# Patient Record
Sex: Female | Born: 1970
Health system: Southern US, Community
[De-identification: ages and names within clinical notes are randomized; demographics above are authoritative.]

## PROBLEM LIST (undated history)

## (undated) DIAGNOSIS — J189 Pneumonia, unspecified organism: Secondary | ICD-10-CM

## (undated) DIAGNOSIS — R7303 Prediabetes: Secondary | ICD-10-CM

## (undated) DIAGNOSIS — F419 Anxiety disorder, unspecified: Secondary | ICD-10-CM

## (undated) DIAGNOSIS — E7800 Pure hypercholesterolemia, unspecified: Secondary | ICD-10-CM

## (undated) DIAGNOSIS — R011 Cardiac murmur, unspecified: Secondary | ICD-10-CM

## (undated) DIAGNOSIS — M1990 Unspecified osteoarthritis, unspecified site: Secondary | ICD-10-CM

## (undated) DIAGNOSIS — R51 Headache: Secondary | ICD-10-CM

## (undated) DIAGNOSIS — T7840XA Allergy, unspecified, initial encounter: Secondary | ICD-10-CM

## (undated) DIAGNOSIS — K219 Gastro-esophageal reflux disease without esophagitis: Secondary | ICD-10-CM

## (undated) DIAGNOSIS — R0981 Nasal congestion: Secondary | ICD-10-CM

## (undated) DIAGNOSIS — J40 Bronchitis, not specified as acute or chronic: Secondary | ICD-10-CM

## (undated) DIAGNOSIS — IMO0001 Reserved for inherently not codable concepts without codable children: Secondary | ICD-10-CM

## (undated) DIAGNOSIS — R519 Headache, unspecified: Secondary | ICD-10-CM

## (undated) DIAGNOSIS — R0989 Other specified symptoms and signs involving the circulatory and respiratory systems: Secondary | ICD-10-CM

## (undated) HISTORY — DX: Unspecified osteoarthritis, unspecified site: M19.90

## (undated) HISTORY — DX: Pure hypercholesterolemia, unspecified: E78.00

## (undated) HISTORY — DX: Anxiety disorder, unspecified: F41.9

## (undated) HISTORY — DX: Cardiac murmur, unspecified: R01.1

## (undated) HISTORY — DX: Prediabetes: R73.03

## (undated) HISTORY — PX: WISDOM TOOTH EXTRACTION: SHX21

## (undated) HISTORY — DX: Allergy, unspecified, initial encounter: T78.40XA

## (undated) HISTORY — PX: BARTHOLIN CYST MARSUPIALIZATION: SHX5383

---

## 1998-07-05 ENCOUNTER — Ambulatory Visit (HOSPITAL_COMMUNITY): Admission: RE | Admit: 1998-07-05 | Discharge: 1998-07-05 | Payer: Self-pay | Admitting: Obstetrics and Gynecology

## 2000-05-28 ENCOUNTER — Encounter: Payer: Self-pay | Admitting: Emergency Medicine

## 2000-05-28 ENCOUNTER — Emergency Department (HOSPITAL_COMMUNITY): Admission: EM | Admit: 2000-05-28 | Discharge: 2000-05-28 | Payer: Self-pay | Admitting: Emergency Medicine

## 2003-12-26 ENCOUNTER — Encounter: Admission: RE | Admit: 2003-12-26 | Discharge: 2004-01-31 | Payer: Self-pay | Admitting: Internal Medicine

## 2004-11-07 ENCOUNTER — Encounter: Admission: RE | Admit: 2004-11-07 | Discharge: 2004-11-07 | Payer: Self-pay | Admitting: Internal Medicine

## 2005-07-24 ENCOUNTER — Other Ambulatory Visit: Admission: RE | Admit: 2005-07-24 | Discharge: 2005-07-24 | Payer: Self-pay | Admitting: Obstetrics and Gynecology

## 2006-07-09 ENCOUNTER — Encounter: Admission: RE | Admit: 2006-07-09 | Discharge: 2006-07-09 | Payer: Self-pay | Admitting: Internal Medicine

## 2006-08-06 ENCOUNTER — Other Ambulatory Visit: Admission: RE | Admit: 2006-08-06 | Discharge: 2006-08-06 | Payer: Self-pay | Admitting: Obstetrics and Gynecology

## 2009-06-03 ENCOUNTER — Encounter: Admission: RE | Admit: 2009-06-03 | Discharge: 2009-06-03 | Payer: Self-pay | Admitting: Internal Medicine

## 2010-11-10 ENCOUNTER — Encounter: Payer: Self-pay | Admitting: Internal Medicine

## 2012-03-28 ENCOUNTER — Ambulatory Visit
Admission: RE | Admit: 2012-03-28 | Discharge: 2012-03-28 | Disposition: A | Discharge status: Home / Self Care | Payer: No Typology Code available for payment source | Source: Ambulatory Visit | Attending: Internal Medicine | Admitting: Internal Medicine

## 2012-03-28 ENCOUNTER — Other Ambulatory Visit: Payer: Self-pay | Admitting: Internal Medicine

## 2012-03-28 DIAGNOSIS — R079 Chest pain, unspecified: Secondary | ICD-10-CM

## 2014-02-20 ENCOUNTER — Other Ambulatory Visit: Payer: Self-pay | Admitting: Internal Medicine

## 2014-02-20 DIAGNOSIS — R42 Dizziness and giddiness: Secondary | ICD-10-CM

## 2014-02-28 ENCOUNTER — Ambulatory Visit
Admission: RE | Admit: 2014-02-28 | Discharge: 2014-02-28 | Disposition: A | Discharge status: Home / Self Care | Payer: PRIVATE HEALTH INSURANCE | Source: Ambulatory Visit | Attending: Internal Medicine | Admitting: Internal Medicine

## 2014-02-28 DIAGNOSIS — R42 Dizziness and giddiness: Secondary | ICD-10-CM

## 2014-02-28 MED ORDER — GADOBENATE DIMEGLUMINE 529 MG/ML IV SOLN
17.0000 mL | Freq: Once | INTRAVENOUS | Status: AC | PRN
  Administered 2014-02-28: 17 mL via INTRAVENOUS

## 2016-02-06 ENCOUNTER — Ambulatory Visit (INDEPENDENT_AMBULATORY_CARE_PROVIDER_SITE_OTHER): Payer: Commercial Managed Care - PPO | Admitting: Urgent Care

## 2016-02-06 VITALS — BP 124/72 | HR 86 | Temp 99.5°F | Resp 18 | Ht 67.0 in | Wt 209.0 lb

## 2016-02-06 DIAGNOSIS — R0789 Other chest pain: Secondary | ICD-10-CM

## 2016-02-06 DIAGNOSIS — J309 Allergic rhinitis, unspecified: Secondary | ICD-10-CM

## 2016-02-06 DIAGNOSIS — R0981 Nasal congestion: Secondary | ICD-10-CM

## 2016-02-06 DIAGNOSIS — R059 Cough, unspecified: Secondary | ICD-10-CM

## 2016-02-06 DIAGNOSIS — Z9109 Other allergy status, other than to drugs and biological substances: Secondary | ICD-10-CM

## 2016-02-06 DIAGNOSIS — J029 Acute pharyngitis, unspecified: Secondary | ICD-10-CM

## 2016-02-06 DIAGNOSIS — R05 Cough: Secondary | ICD-10-CM | POA: Diagnosis not present

## 2016-02-06 DIAGNOSIS — Z889 Allergy status to unspecified drugs, medicaments and biological substances status: Secondary | ICD-10-CM

## 2016-02-06 LAB — POCT CBC
Granulocyte percent: 62.8 %G (ref 37–80)
HEMATOCRIT: 41.4 % (ref 37.7–47.9)
HEMOGLOBIN: 14.7 g/dL (ref 12.2–16.2)
LYMPH, POC: 3.1 (ref 0.6–3.4)
MCH, POC: 27.6 pg (ref 27–31.2)
MCHC: 35.6 g/dL — AB (ref 31.8–35.4)
MCV: 77.7 fL — AB (ref 80–97)
MID (cbc): 0.9 (ref 0–0.9)
MPV: 8.1 fL (ref 0–99.8)
POC GRANULOCYTE: 6.7 (ref 2–6.9)
POC LYMPH PERCENT: 28.8 %L (ref 10–50)
POC MID %: 8.4 %M (ref 0–12)
Platelet Count, POC: 341 10*3/uL (ref 142–424)
RBC: 5.33 M/uL (ref 4.04–5.48)
RDW, POC: 15.2 %
WBC: 10.6 10*3/uL — AB (ref 4.6–10.2)

## 2016-02-06 MED ORDER — HYDROCODONE-HOMATROPINE 5-1.5 MG/5ML PO SYRP: 5.0000 mL | ORAL_SOLUTION | Freq: Every evening | ORAL | Status: DC | PRN

## 2016-02-06 MED ORDER — CETIRIZINE HCL 10 MG PO TABS: 10.0000 mg | ORAL_TABLET | Freq: Every day | ORAL | Status: DC

## 2016-02-06 MED ORDER — BENZONATATE 100 MG PO CAPS: 100.0000 mg | ORAL_CAPSULE | Freq: Three times a day (TID) | ORAL | Status: DC | PRN

## 2016-02-06 MED ORDER — PSEUDOEPHEDRINE HCL ER 120 MG PO TB12: 120.0000 mg | ORAL_TABLET | Freq: Two times a day (BID) | ORAL | Status: DC

## 2016-02-06 MED ORDER — ALBUTEROL SULFATE (2.5 MG/3ML) 0.083% IN NEBU
2.5000 mg | INHALATION_SOLUTION | Freq: Once | RESPIRATORY_TRACT | Status: AC
  Administered 2016-02-06: 2.5 mg via RESPIRATORY_TRACT

## 2016-02-06 NOTE — Progress Notes (Signed)
    MRN: [id number] DOB: 01-Jun-1971  Subjective:   Crystal Harris is a 45 y.o. female presenting for chief complaint of Cough; Sinusitis; and Bronchitis  Reports 5 day history of scratchy throat, sinus congestion, sinus drainage, itchy ears, mildly productive cough, cough elicited chest pain, burning sensation in her chest. She has been seen for this already, started her on amoxicillin and robitussin, muscle relaxant for her back. A chest x-ray was done, was normal. Patient did start taking generic Claritin twice. She has seen an allergist several years ago and had multiple environmental allergies. Used to get series of steroid shots and eventually stopped going because they got better. Denies smoking cigarettes.  Crystal Harris has a current medication list which includes the following prescription(s): amitriptyline and amoxicillin. Also has no allergies on file.  Crystal Harris  has a past medical history of Heart murmur; Allergy; Anemia; Anxiety; and Arthritis. Also  has past surgical history that includes Cesarean section.  Her family history includes Cancer in her father and sister; Diabetes in her mother; Hyperlipidemia in her brother and mother; Hypertension in her father and sister.   Objective:   Vitals: BP 124/72 mmHg  Pulse 86  Temp(Src) 99.5 F (37.5 C) (Oral)  Resp 18  Ht 5\' 7"  (1.702 m)  Wt 209 lb (94.802 kg)  BMI 32.73 kg/m2  SpO2 98%  LMP 01/16/2016  Physical Exam  Constitutional: She is oriented to person, place, and time. She appears well-developed and well-nourished.  HENT:  TM's flat bilaterally, no effusions or erythema. Nasal turbinates boggy with clear mucus. No sinus tenderness. Significant postnasal drip present, without oropharyngeal exudates, erythema or abscesses.  Eyes: Right eye exhibits no discharge. Left eye exhibits no discharge. No scleral icterus.  Neck: Normal range of motion. Neck supple.  Cardiovascular: Normal rate, regular rhythm and intact distal pulses.   Exam reveals no gallop and no friction rub.   No murmur heard. Pulmonary/Chest: No respiratory distress. She has no wheezes. She has no rales.  Abdominal: Soft. Bowel sounds are normal. She exhibits no distension and no mass. There is no tenderness.  Lymphadenopathy:    She has no cervical adenopathy.  Neurological: She is alert and oriented to person, place, and time.  Skin: Skin is warm and dry.   No results found for this or any previous visit (from the past 24 hour(s)).  Assessment and Plan :   1. Allergic rhinitis, unspecified allergic rhinitis type 2. Multiple allergies 3. Cough 4. Atypical chest pain 5. Nasal congestion 6. Sore throat - Likely allergy related. Recommended patient start Zyrtec, Sudafed. Use Hycodan and Tessalon as needed for cough and sore throat. RTC in 1 week if no improvement.  Jaynee Eagles, PA-C [facility] [phone] 02/06/2016 5:10 PM

## 2016-02-06 NOTE — Patient Instructions (Addendum)
Allergies An allergy is an abnormal reaction to a substance by the body's defense system (immune system). Allergies can develop at any age. WHAT CAUSES ALLERGIES? An allergic reaction happens when the immune system mistakenly reacts to a normally harmless substance, called an allergen, as if it were harmful. The immune system releases antibodies to fight the substance. Antibodies eventually release a chemical called histamine into the bloodstream. The release of histamine is meant to protect the body from infection, but it also causes discomfort. An allergic reaction can be triggered by:  Eating an allergen.  Inhaling an allergen.  Touching an allergen. WHAT TYPES OF ALLERGIES ARE THERE? There are many types of allergies. Common types include:  Seasonal allergies. People with this type of allergy are usually allergic to substances that are only present during certain seasons, such as molds and pollens.  Food allergies.  Drug allergies.  Insect allergies.  Animal dander allergies. WHAT ARE SYMPTOMS OF ALLERGIES? Possible allergy symptoms include:  Swelling of the lips, face, tongue, mouth, or throat.  Sneezing, coughing, or wheezing.  Nasal congestion.  Tingling in the mouth.  Rash.  Itching.  Itchy, red, swollen areas of skin (hives).  Watery eyes.  Vomiting.  Diarrhea.  Dizziness.  Lightheadedness.  Fainting.  Trouble breathing or swallowing.  Chest tightness.  Rapid heartbeat. HOW ARE ALLERGIES DIAGNOSED? Allergies are diagnosed with a medical and family history and one or more of the following:  Skin tests.  Blood tests.  A food diary. A food diary is a record of all the foods and drinks you have in a day and of all the symptoms you experience.  The results of an elimination diet. An elimination diet involves eliminating foods from your diet and then adding them back in one by one to find out if a certain food causes an allergic reaction. HOW ARE  ALLERGIES TREATED? There is no cure for allergies, but allergic reactions can be treated with medicine. Severe reactions usually need to be treated at a hospital. HOW CAN REACTIONS BE PREVENTED? The best way to prevent an allergic reaction is by avoiding the substance you are allergic to. Allergy shots and medicines can also help prevent reactions in some cases. People with severe allergic reactions may be able to prevent a life-threatening reaction called anaphylaxis with a medicine given right after exposure to the allergen.   This information is not intended to replace advice given to you by your health care provider. Make sure you discuss any questions you have with your health care provider.   Document Released: 12/29/2002 Document Revised: 10/26/2014 Document Reviewed: 07/17/2014 Elsevier Interactive Patient Education 2016 [employer] Inc[employer]    Cough, Adult Coughing is a reflex that clears your throat and your airways. Coughing helps to heal and protect your lungs. It is normal to cough occasionally, but a cough that happens with other symptoms or lasts a long time may be a sign of a condition that needs treatment. A cough may last only 2-3 weeks (acute), or it may last longer than 8 weeks (chronic). CAUSES Coughing is commonly caused by:  Breathing in substances that irritate your lungs.  A viral or bacterial respiratory infection.  Allergies.  Asthma.  Postnasal drip.  Smoking.  Acid backing up from the stomach into the esophagus (gastroesophageal reflux).  Certain medicines.  Chronic lung problems, including COPD (or rarely, lung cancer).  Other medical conditions such as heart failure. HOME CARE INSTRUCTIONS  Pay attention to any changes in your symptoms. Take these  actions to help with your discomfort:  Take medicines only as told by your health care provider.  If you were prescribed an antibiotic medicine, take it as told by your health care provider. Do not stop  taking the antibiotic even if you start to feel better.  Talk with your health care provider before you take a cough suppressant medicine.  Drink enough fluid to keep your urine clear or pale yellow.  If the air is dry, use a cold steam vaporizer or humidifier in your bedroom or your home to help loosen secretions.  Avoid anything that causes you to cough at work or at home.  If your cough is worse at night, try sleeping in a semi-upright position.  Avoid cigarette smoke. If you smoke, quit smoking. If you need help quitting, ask your health care provider.  Avoid caffeine.  Avoid alcohol.  Rest as needed. SEEK MEDICAL CARE IF:   You have new symptoms.  You cough up pus.  Your cough does not get better after 2-3 weeks, or your cough gets worse.  You cannot control your cough with suppressant medicines and you are losing sleep.  You develop pain that is getting worse or pain that is not controlled with pain medicines.  You have a fever.  You have unexplained weight loss.  You have night sweats. SEEK IMMEDIATE MEDICAL CARE IF:  You cough up blood.  You have difficulty breathing.  Your heartbeat is very fast.   This information is not intended to replace advice given to you by your health care provider. Make sure you discuss any questions you have with your health care provider.   Document Released: 04/03/2011 Document Revised: 06/26/2015 Document Reviewed: 12/12/2014 Elsevier Interactive Patient Education 2016 [employer].     IF you received an x-ray today, you will receive an invoice from [facility]. Please contact [facility] at [phone] with questions or concerns regarding your invoice.   IF you received labwork today, you will receive an invoice from [employer]. Please contact [facility] at [phone] with questions or concerns regarding your invoice.   Our billing staff will not be able to assist you with  questions regarding bills from these companies.  You will be contacted with the lab results as soon as they are available. The fastest way to get your results is to activate your My Chart account. Instructions are located on the last page of this paperwork. If you have not heard from [place] regarding the results in 2 weeks, please contact this office.

## 2016-02-08 ENCOUNTER — Encounter: Payer: Self-pay | Admitting: Podiatry

## 2016-02-08 ENCOUNTER — Ambulatory Visit (INDEPENDENT_AMBULATORY_CARE_PROVIDER_SITE_OTHER): Payer: Commercial Managed Care - PPO | Admitting: Podiatry

## 2016-02-08 ENCOUNTER — Ambulatory Visit (INDEPENDENT_AMBULATORY_CARE_PROVIDER_SITE_OTHER): Payer: Commercial Managed Care - PPO

## 2016-02-08 DIAGNOSIS — R52 Pain, unspecified: Secondary | ICD-10-CM | POA: Diagnosis not present

## 2016-02-08 DIAGNOSIS — M779 Enthesopathy, unspecified: Secondary | ICD-10-CM

## 2016-02-08 DIAGNOSIS — M2580 Other specified joint disorders, unspecified joint: Secondary | ICD-10-CM

## 2016-02-08 NOTE — Patient Instructions (Signed)

## 2016-02-08 NOTE — Progress Notes (Signed)
   Subjective:    Patient ID: Crystal Harris, female    DOB: 1971-07-19, 45 y.o.   MRN: [id number]  HPI  45 year old female presents the office today for concerns of pain to the ball of her right foot for which she points submetatarsal one. This been ongoing for last several weeks and she has pain when she goes barefoot or she wears flat shoes. She denies any injury or trauma. She has noticed some swelling to the area without any redness. No tingling or numbness. She's had no previous treatment. No other complaints at this time.   Review of Systems  All other systems reviewed and are negative.      Objective:   Physical Exam General: AAO x3, NAD  Dermatological: Small hyperkeratotic lesions are present medial hallux IPJ as well as submetatarsal 2. No underlying ulceration, drainage or other signs of infection. No other open lesions or pre-ulcer lesions identified at this time.  Vascular: Dorsalis Pedis artery and Posterior Tibial artery pedal pulses are 2/4 bilateral with immedate capillary fill time. Pedal hair growth present. No varicosities and no lower extremity edema present bilateral. There is no pain with calf compression, swelling, warmth, erythema.   Neruologic: Grossly intact via light touch bilateral. Vibratory intact via tuning fork bilateral. Protective threshold with Semmes Wienstein monofilament intact to all pedal sites bilateral. Patellar and Achilles deep tendon reflexes 2+ bilateral. No Babinski or clonus noted bilateral.   Musculoskeletal: There is tenderness underlying sesamoids and the right foot. There is localized edema to this area without any erythema or increase in warmth. There is no pain with MPJ range of motion. There is no pain metatarsal of the digit. There is no pain to the other areas of the foot bilaterally. There is equinus present. MMT 5/5. Mild HAV on right.   Gait: Unassisted, Nonantalgic.       Assessment & Plan:  45 year old female right  sesamoiditis -Treatment options discussed including all alternatives, risks, and complications -Etiology of symptoms were discussed -X-rays were obtained and reviewed with the patient. No evidence of acute fracture or stress fracture. -Given other medical conditions she cannot take anti-inflammatory she states. We'll hold off. -Discussed steroid injection for which she wishes to proceed with. Under sterile conditions a mixture of dexamethasone phosphate and local anesthetic was infiltrated around the sesamoid complex and the right foot. Postinjection care with discussed. -Offloading has were dispensed. -Discussed shoe gear changes. -Equinus exercises. -Hyperkeratotic lesions lightly debrided. -Follow up in 4 weeks if symptoms continue or sooner if any issues are to arise. Encouraged to call the questions, concerns or any change in symptoms.  Celesta Gentile, DPM

## 2016-03-06 ENCOUNTER — Ambulatory Visit (INDEPENDENT_AMBULATORY_CARE_PROVIDER_SITE_OTHER): Payer: Commercial Managed Care - PPO | Admitting: Podiatry

## 2016-03-06 ENCOUNTER — Encounter: Payer: Self-pay | Admitting: Podiatry

## 2016-03-06 VITALS — BP 118/75 | HR 90 | Resp 18

## 2016-03-06 DIAGNOSIS — M2580 Other specified joint disorders, unspecified joint: Secondary | ICD-10-CM

## 2016-03-06 MED ORDER — MELOXICAM 15 MG PO TABS: 15.0000 mg | ORAL_TABLET | Freq: Every day | ORAL | Status: DC

## 2016-03-06 MED ORDER — METHYLPREDNISOLONE 4 MG PO TBPK: ORAL_TABLET | ORAL | Status: DC

## 2016-03-06 NOTE — Patient Instructions (Signed)
Start taking the medrol dose pack once this is compete you can start mobic (meloxicam) but don't take together.

## 2016-03-11 NOTE — Progress Notes (Signed)
Patient ID: Crystal Harris, female   DOB: June 26, 1971, 45 y.o.   MRN: [id number]  Subjective: 45 year old female presents the office they for follow-up evaluation of sesamoiditis on the right foot. She states that she's had some improvement in symptoms she does continue to have pain. The swelling has improved. No redness or warmth. She has not been wearing the pad every day but when she does it does help. Denies any systemic complaints such as fevers, chills, nausea, vomiting. No acute changes since last appointment, and no other complaints at this time.   Objective: AAO x3, NAD DP/PT pulses palpable bilaterally, CRT less than 3 seconds There is continuation tenderness as well as has wide-complex of the right foot. There is localized edema to this area for any erythema or increase in warmth. There is no pain vibratory sensation.  MPJ range of motion. No other areas of tenderness. No areas of pinpoint bony tenderness or pain with vibratory sensation. MMT 5/5, ROM WNL. No edema, erythema, increase in warmth to bilateral lower extremities.  No open lesions or pre-ulcerative lesions.  No pain with calf compression, swelling, warmth, erythema  Assessment: Right foot likely sesamoiditis  Plan: -All treatment options discussed with the patient including all alternatives, risks, complications.  -At this time I discussed further treatment options. Prescribed Medrol Dosepak. Once this is complete she can start meloxicam. Do not together and she verbally understood. Discussed side effects lesion to medicines. Also continue offloading pads for now. Discussed custom orthotics. She can modifications. Ice to the area. Follow as scheduled. -Patient encouraged to call the office with any questions, concerns, change in symptoms.   Celesta Gentile, DPM

## 2016-04-03 ENCOUNTER — Encounter: Payer: Self-pay | Admitting: Podiatry

## 2016-04-03 ENCOUNTER — Ambulatory Visit (INDEPENDENT_AMBULATORY_CARE_PROVIDER_SITE_OTHER): Payer: Commercial Managed Care - PPO | Admitting: Podiatry

## 2016-04-03 DIAGNOSIS — M779 Enthesopathy, unspecified: Secondary | ICD-10-CM | POA: Diagnosis not present

## 2016-04-03 DIAGNOSIS — M2580 Other specified joint disorders, unspecified joint: Secondary | ICD-10-CM

## 2016-04-09 NOTE — Progress Notes (Signed)
Patient ID: MAYTE CERISE, female   DOB: April 30, 1971, 45 y.o.   MRN: [id number]  Subjective: 45 year old female presents the office they for follow-up evaluation of sesamoiditis on the right foot. She states that she is doing somewhat better, the last appointment information as gone down but she still has soreness when she is on her feet all day. She does still have some swelling to the area and denies any redness or warmth. No recent injury. No other complains.  Objective: AAO x3, NAD DP/PT pulses palpable bilaterally, CRT less than 3 seconds There is continuation tenderness on the plantar aspect the right foot along the sesamoids. There is decreased edema however does persist somewhat. There is no redness or warmth to the area. No pain with MPJ range of motion. No other areas of tenderness bilateral lower extremities.  No edema, erythema, increase in warmth to bilateral lower extremities.  No open lesions or pre-ulcerative lesions.  No pain with calf compression, swelling, warmth, erythema  Assessment: Right foot likely sesamoiditis  Plan: -All treatment options discussed with the patient including all alternatives, risks, complications.  -At this time a discussed steroid injection she wishes to proceed with this. Under sterile conditions a mixture Dexamethasone phosphate and local wound site was infiltrated without complications or bleeding. Post injection care was discussed with the patient. -Offloading pads were dispensed. -Follow-up in 3 weeks or sooner if any problems arise. In the meantime, encouraged to call the office with any questions, concerns, change in symptoms.   Celesta Gentile, DPM

## 2016-04-18 ENCOUNTER — Telehealth: Payer: Self-pay

## 2016-04-18 ENCOUNTER — Other Ambulatory Visit: Payer: Self-pay | Admitting: Emergency Medicine

## 2016-04-18 ENCOUNTER — Ambulatory Visit (INDEPENDENT_AMBULATORY_CARE_PROVIDER_SITE_OTHER): Payer: Commercial Managed Care - PPO | Admitting: Family Medicine

## 2016-04-18 VITALS — BP 100/72 | HR 98 | Temp 98.0°F | Resp 16 | Ht 66.25 in | Wt 206.4 lb

## 2016-04-18 DIAGNOSIS — R5383 Other fatigue: Secondary | ICD-10-CM | POA: Diagnosis not present

## 2016-04-18 DIAGNOSIS — Z889 Allergy status to unspecified drugs, medicaments and biological substances status: Secondary | ICD-10-CM

## 2016-04-18 DIAGNOSIS — Z9109 Other allergy status, other than to drugs and biological substances: Secondary | ICD-10-CM | POA: Diagnosis not present

## 2016-04-18 DIAGNOSIS — R0981 Nasal congestion: Secondary | ICD-10-CM | POA: Diagnosis not present

## 2016-04-18 DIAGNOSIS — N921 Excessive and frequent menstruation with irregular cycle: Secondary | ICD-10-CM | POA: Diagnosis not present

## 2016-04-18 LAB — POCT CBC
Granulocyte percent: 60.6 %G (ref 37–80)
HCT, POC: 38.2 % (ref 37.7–47.9)
HEMOGLOBIN: 13.1 g/dL (ref 12.2–16.2)
Lymph, poc: 2.9 (ref 0.6–3.4)
MCH: 28.2 pg (ref 27–31.2)
MCHC: 34.5 g/dL (ref 31.8–35.4)
MCV: 81.7 fL (ref 80–97)
MID (cbc): 0.7 (ref 0–0.9)
MPV: 7.9 fL (ref 0–99.8)
PLATELET COUNT, POC: 303 10*3/uL (ref 142–424)
POC Granulocyte: 5.6 (ref 2–6.9)
POC LYMPH PERCENT: 31.5 %L (ref 10–50)
POC MID %: 7.9 %M (ref 0–12)
RBC: 4.67 M/uL (ref 4.04–5.48)
RDW, POC: 15.7 %
WBC: 9.3 10*3/uL (ref 4.6–10.2)

## 2016-04-18 MED ORDER — ESTRADIOL 1 MG PO TABS: 4.0000 mg | ORAL_TABLET | Freq: Four times a day (QID) | ORAL | Status: DC

## 2016-04-18 MED ORDER — AMITRIPTYLINE HCL 10 MG PO TABS: 10.0000 mg | ORAL_TABLET | Freq: Every day | ORAL | Status: DC

## 2016-04-18 MED ORDER — ONDANSETRON 4 MG PO TBDP: 4.0000 mg | ORAL_TABLET | Freq: Three times a day (TID) | ORAL | Status: DC | PRN

## 2016-04-18 MED ORDER — CETIRIZINE HCL 10 MG PO TABS: 10.0000 mg | ORAL_TABLET | Freq: Every day | ORAL | Status: DC

## 2016-04-18 MED ORDER — MEDROXYPROGESTERONE ACETATE 10 MG PO TABS: 10.0000 mg | ORAL_TABLET | Freq: Every day | ORAL | Status: DC

## 2016-04-18 MED ORDER — ESTRADIOL 2 MG PO TABS: 8.0000 mg | ORAL_TABLET | Freq: Four times a day (QID) | ORAL | Status: DC

## 2016-04-18 NOTE — Progress Notes (Addendum)
Subjective:  By signing my name below, I, Moises Blood, attest that this documentation has been prepared under the direction and in the presence of Delman Cheadle, MD. Electronically Signed: Moises Blood, [place]. 04/18/2016 , 2:17 PM .  Patient was seen in Room 8 .   Patient ID: Crystal Harris, female    DOB: Jan 21, 1971, 45 y.o.   MRN: [id number] Chief Complaint  Patient presents with  . MENTRAL CYCLE    heavy started 2 days ago  . Fatigue  . Medication Refill    amitriptyline and zyrtec   HPI Crystal Harris is a 45 y.o. female who presents to [facility] complaining of heavy menstrual cycle that was noticed yesterday. Patient states her cycle started 2 days ago. She mentions her period has been heavier and needing to change pads about every hour. She also notes menses becoming irregular, LMP was 2.5 weeks ago and they were much lighter than usual. She's feeling more fatigue but denies history of anemia. She reports having lightheadedness and dizziness but has had this ongoing for a while with association to vertigo. She denies vaginal discharge or change in frequency. She did notice some discomfort when urinating, similar to when she had UTI's in the past. Her last pelvic exam was done about over 5 years ago.   She also requested medication refill. Her last thyroid was checked a few years ago. She denies PCP at the moment.   Past Medical History  Diagnosis Date  . Heart murmur   . Allergy   . Anemia   . Anxiety   . Arthritis    Prior to Admission medications   Medication Sig Start Date End Date Taking? Authorizing Provider  amitriptyline (ELAVIL) 10 MG tablet Take 10 mg by mouth at bedtime.   Yes Historical Provider, MD  cetirizine (ZYRTEC) 10 MG tablet Take 1 tablet (10 mg total) by mouth daily. 02/06/16  Yes Jaynee Eagles, PA-C  meloxicam (MOBIC) 15 MG tablet Take 1 tablet (15 mg total) by mouth daily. Patient not taking: Reported on 04/18/2016 03/06/16   Trula Slade, DPM   No Known  Allergies  Review of Systems  Constitutional: Positive for fatigue. Negative for fever and chills.  Gastrointestinal: Negative for nausea, vomiting and diarrhea.  Genitourinary: Positive for dysuria and menstrual problem. Negative for urgency, frequency, hematuria and vaginal discharge.  Neurological: Positive for dizziness and light-headedness.       Objective:   Physical Exam  Constitutional: She is oriented to person, place, and time. She appears well-developed and well-nourished. No distress.  HENT:  Head: Normocephalic and atraumatic.  Eyes: EOM are normal. Pupils are equal, round, and reactive to light.  Neck: Neck supple.  Cardiovascular: Normal rate.   Pulmonary/Chest: Effort normal and breath sounds normal. No respiratory distress.  Genitourinary:  Normal external labia, normal vagina, large amount of dark red maroon cervical blood from os; cervix normal, some clotting in vault, no cervical motion tenderness; uterus felt firm and large, no adnexal fullness or tenderness  Musculoskeletal: Normal range of motion.  Neurological: She is alert and oriented to person, place, and time.  Skin: Skin is warm and dry.  Psychiatric: She has a normal mood and affect. Her behavior is normal.  Nursing note and vitals reviewed.   BP 100/72 mmHg  Pulse 98  Temp(Src) 98 F (36.7 C) (Oral)  Resp 16  Ht 5' 6.25" (1.683 m)  Wt 206 lb 6.4 oz (93.622 kg)  BMI 33.05 kg/m2  SpO2 98%  LMP 04/16/2016   Results for orders placed or performed in visit on 04/18/16  POCT CBC  Result Value Ref Range   WBC 9.3 4.6 - 10.2 K/uL   Lymph, poc 2.9 0.6 - 3.4   POC LYMPH PERCENT 31.5 10 - 50 %L   MID (cbc) 0.7 0 - 0.9   POC MID % 7.9 0 - 12 %M   POC Granulocyte 5.6 2 - 6.9   Granulocyte percent 60.6 37 - 80 %G   RBC 4.67 4.04 - 5.48 M/uL   Hemoglobin 13.1 12.2 - 16.2 g/dL   HCT, POC 38.2 37.7 - 47.9 %   MCV 81.7 80 - 97 fL   MCH, POC 28.2 27 - 31.2 pg   MCHC 34.5 31.8 - 35.4 g/dL   RDW, POC  15.7 %   Platelet Count, POC 303 142 - 424 K/uL   MPV 7.9 0 - 99.8 fL       Assessment & Plan:   1. Menorrhagia with irregular cycle  - pt with impressively heavy menstrual bleeding confirmed on exam.  Will refer to gyn for more complete lab work up and pelvic/transvag US.  Will use high dose estrogen to stop menses with estradiol 1mg  qid followed by provera 10mg  x 10d.  zofran in case high dose estrogen -> nausea  2. Other fatigue - try to increase elavil from 10 to 25mg  qhs  3. Nasal congestion   4. Multiple allergies - refill zyrtec.     Orders Placed This Encounter  Procedures  . TSH  . Ambulatory referral to Gynecology    Referral Priority:  Routine    Referral Type:  Consultation    Referral Reason:  Specialty Services Required    Requested Specialty:  Gynecology    Number of Visits Requested:  1  . POCT CBC    Meds ordered this encounter  Medications  . cetirizine (ZYRTEC) 10 MG tablet    Sig: Take 1 tablet (10 mg total) by mouth daily.    Dispense:  30 tablet    Refill:  11  . amitriptyline (ELAVIL) 10 MG tablet    Sig: Take 1 tablet (10 mg total) by mouth at bedtime.    Dispense:  90 tablet    Refill:  3  . DISCONTD: estradiol (ESTRACE) 2 MG tablet    Sig: Take 4 tablets (8 mg total) by mouth 4 (four) times daily. Until bleeding stops    Dispense:  80 tablet    Refill:  0  . medroxyPROGESTERone (PROVERA) 10 MG tablet    Sig: Take 1 tablet (10 mg total) by mouth daily.    Dispense:  10 tablet    Refill:  0  . ondansetron (ZOFRAN ODT) 4 MG disintegrating tablet    Sig: Take 1 tablet (4 mg total) by mouth every 8 (eight) hours as needed for nausea or vomiting.    Dispense:  20 tablet    Refill:  0  . DISCONTD: estradiol (ESTRACE) 1 MG tablet    Sig: Take 4 tablets (4 mg total) by mouth 4 (four) times daily. Until bleeding stops    Dispense:  80 tablet    Refill:  0    Please disregard prior rx for the 2mg  pills - pt is to take 4mg  qid (so will do 4 tabs of 1  mg qid or ok to do 2 tabs of the 2mg  pills qid if cheaper and make sure you let pt know about change in tab #).  Thanks!  Marland Kitchen estradiol (ESTRACE) 1 MG tablet    Sig: Take 4 tablets (4 mg total) by mouth 4 (four) times daily. Until bleeding stops    Dispense:  80 tablet    Refill:  0    Please disregard prior rx for the 2mg  pills    I personally performed the services described in this documentation, which was scribed in my presence. The recorded information has been reviewed and considered, and addended by me as needed.   Delman Cheadle, M.D.  [facility] [address] [place], [place] [address] [phone] phone [phone] fax  04/27/2016 1:49 PM

## 2016-04-18 NOTE — Patient Instructions (Addendum)
IF you received an x-ray today, you will receive an invoice from [facility]. Please contact [facility] at [phone] with questions or concerns regarding your invoice.   IF you received labwork today, you will receive an invoice from [employer]. Please contact [facility] at [phone] with questions or concerns regarding your invoice.   Our billing staff will not be able to assist you with questions regarding bills from these companies.  You will be contacted with the lab results as soon as they are available. The fastest way to get your results is to activate your My Chart account. Instructions are located on the last page of this paperwork. If you have not heard from [place] regarding the results in 2 weeks, please contact this office.    Estradiol 8 mg four times per dayestrogen  until the bleeding subsides or is minimal. Do not continue this regimen for more than 21 to 25 days. After the is discontinued, start oral medroxyprogesterone acetate (10 mg/day) for 10 days.  Menorrhagia Menorrhagia is the medical term for when your menstrual periods are heavy or last longer than usual. With menorrhagia, every period you have may cause enough blood loss and cramping that you are unable to maintain your usual activities. CAUSES  In some cases, the cause of heavy periods is unknown, but a number of conditions may cause menorrhagia. Common causes include:  A problem with the hormone-producing thyroid gland (hypothyroid).  Noncancerous growths in the uterus (polyps or fibroids).  An imbalance of the estrogen and progesterone hormones.  One of your ovaries not releasing an egg during one or more months.  Side effects of having an intrauterine device (IUD).  Side effects of some medicines, such as anti-inflammatory medicines or blood thinners.  A bleeding disorder that stops your blood from clotting normally. SIGNS AND SYMPTOMS  During a normal  period, bleeding lasts between 4 and 8 days. Signs that your periods are too heavy include:  You routinely have to change your pad or tampon every 1 or 2 hours because it is completely soaked.  You pass blood clots larger than 1 inch (2.5 cm) in size.  You have bleeding for more than 7 days.  You need to use pads and tampons at the same time because of heavy bleeding.  You need to wake up to change your pads or tampons during the night.  You have symptoms of anemia, such as tiredness, fatigue, or shortness of breath. DIAGNOSIS  Your health care provider will perform a physical exam and ask you questions about your symptoms and menstrual history. Other tests may be ordered based on what the health care provider finds during the exam. These tests can include:  Blood tests. Blood tests are used to check if you are pregnant or have hormonal changes, a bleeding or thyroid disorder, low iron levels (anemia), or other problems.  Endometrial biopsy. Your health care provider takes a sample of tissue from the inside of your uterus to be examined under a microscope.  Pelvic ultrasound. This test uses sound waves to make a picture of your uterus, ovaries, and vagina. The pictures can show if you have fibroids or other growths.  Hysteroscopy. For this test, your health care provider will use a small telescope to look inside your uterus. Based on the results of your initial tests, your health care provider may recommend further testing. TREATMENT  Treatment may not be needed. If it is needed, your health care provider may  recommend treatment with one or more medicines first. If these do not reduce bleeding enough, a surgical treatment might be an option. The best treatment for you will depend on:   Whether you need to prevent pregnancy.  Your desire to have children in the future.  The cause and severity of your bleeding.  Your opinion and personal preference.  Medicines for menorrhagia may  include:  Birth control methods that use hormones. These include the pill, skin patch, vaginal ring, shots that you get every 3 months, hormonal IUD, and implant. These treatments reduce bleeding during your menstrual period.  Medicines that thicken blood and slow bleeding.  Medicines that reduce swelling, such as ibuprofen.  Medicines that contain a synthetic hormone called progestin.   Medicines that make the ovaries stop working for a short time.  You may need surgical treatment for menorrhagia if the medicines are unsuccessful. Treatment options include:  Dilation and curettage (D&C). In this procedure, your health care provider opens (dilates) your cervix and then scrapes or suctions tissue from the lining of your uterus to reduce menstrual bleeding.  Operative hysteroscopy. This procedure uses a tiny tube with a light (hysteroscope) to view your uterine cavity and can help in the surgical removal of a polyp that may be causing heavy periods.  Endometrial ablation. Through various techniques, your health care provider permanently destroys the entire lining of your uterus (endometrium). After endometrial ablation, most women have little or no menstrual flow. Endometrial ablation reduces your ability to become pregnant.  Endometrial resection. This surgical procedure uses an electrosurgical wire loop to remove the lining of the uterus. This procedure also reduces your ability to become pregnant.  Hysterectomy. Surgical removal of the uterus and cervix is a permanent procedure that stops menstrual periods. Pregnancy is not possible after a hysterectomy. This procedure requires anesthesia and hospitalization. HOME CARE INSTRUCTIONS   Only take over-the-counter or prescription medicines as directed by your health care provider. Take prescribed medicines exactly as directed. Do not change or switch medicines without consulting your health care provider.  Take any prescribed iron pills  exactly as directed by your health care provider. Long-term heavy bleeding may result in low iron levels. Iron pills help replace the iron your body lost from heavy bleeding. Iron may cause constipation. If this becomes a problem, increase the bran, fruits, and roughage in your diet.  Do not take aspirin or medicines that contain aspirin 1 week before or during your menstrual period. Aspirin may make the bleeding worse.  If you need to change your sanitary pad or tampon more than once every 2 hours, stay in bed and rest as much as possible until the bleeding stops.  Eat well-balanced meals. Eat foods high in iron. Examples are leafy green vegetables, meat, liver, eggs, and whole grain breads and cereals. Do not try to lose weight until the abnormal bleeding has stopped and your blood iron level is back to normal. SEEK MEDICAL CARE IF:   You soak through a pad or tampon every 1 or 2 hours, and this happens every time you have a period.  You need to use pads and tampons at the same time because you are bleeding so much.  You need to change your pad or tampon during the night.  You have a period that lasts for more than 8 days.  You pass clots bigger than 1 inch wide.  You have irregular periods that happen more or less often than once a month.  You  feel dizzy or faint.  You feel very weak or tired.  You feel short of breath or feel your heart is beating too fast when you exercise.  You have nausea and vomiting or diarrhea while you are taking your medicine.  You have any problems that may be related to the medicine you are taking. SEEK IMMEDIATE MEDICAL CARE IF:   You soak through 4 or more pads or tampons in 2 hours.  You have any bleeding while you are pregnant. MAKE SURE YOU:   Understand these instructions.  Will watch your condition.  Will get help right away if you are not doing well or get worse.   This information is not intended to replace advice given to you by your  health care provider. Make sure you discuss any questions you have with your health care provider.   Document Released: 10/05/2005 Document Revised: 10/10/2013 Document Reviewed: 03/26/2013 Elsevier Interactive Patient Education [employer].

## 2016-04-19 LAB — TSH: TSH: 2.32 m[IU]/L

## 2016-04-21 ENCOUNTER — Encounter: Payer: Self-pay | Admitting: Family Medicine

## 2016-05-04 ENCOUNTER — Ambulatory Visit: Payer: Commercial Managed Care - PPO | Admitting: Podiatry

## 2016-05-08 ENCOUNTER — Ambulatory Visit (INDEPENDENT_AMBULATORY_CARE_PROVIDER_SITE_OTHER): Payer: Commercial Managed Care - PPO | Admitting: Gynecology

## 2016-05-08 ENCOUNTER — Encounter: Payer: Self-pay | Admitting: Gynecology

## 2016-05-08 VITALS — BP 130/78 | Ht 66.5 in | Wt 208.0 lb

## 2016-05-08 DIAGNOSIS — N938 Other specified abnormal uterine and vaginal bleeding: Secondary | ICD-10-CM | POA: Diagnosis not present

## 2016-05-08 LAB — PREGNANCY, URINE: Preg Test, Ur: NEGATIVE

## 2016-05-08 MED ORDER — MEGESTROL ACETATE 40 MG PO TABS: 40.0000 mg | ORAL_TABLET | Freq: Two times a day (BID) | ORAL | Status: DC

## 2016-05-08 NOTE — Progress Notes (Signed)
   HPI: Patient is a 45 year old gravida 3 para 2 AB 1 (1 elective AB, 1 vaginal delivery, one cesarean section) who was referred to our practice as a courtesy of Dr. Rita Ohara as a result of patient's recent dysfunction uterine bleeding. She was seen at the urgent care on July 1 reporting heavy menstrual bleeding whereby tampons and pad were being change periodically. She stated for the past several months her cycles has started to become irregular and she has not been using any form of contraception she was having some lightheadedness and dizziness during that time she thought it was vertigo. She had a CBC done at that office visit which demonstrated her white blood count was 9.3 and her hemoglobin hematocrit 13.1 and 30.2 respectively with a platelet count 303,000. Patient stated that prior to that visit she had not seen a gynecologist in over 10 years and reports no prior history of any abnormal Pap smears. When patient was in the urgent care she had been prescribed Estrace 1 tablet 4 times a day to stop her bleeding and then to take Provera which she took for 10 days. She is continued to bleed afterwards.    ROS: A ROS was performed and pertinent positives and negatives are included in the history.  GENERAL: No fevers or chills. HEENT: No change in vision, no earache, sore throat or sinus congestion. NECK: No pain or stiffness. CARDIOVASCULAR: No chest pain or pressure. No palpitations. PULMONARY: No shortness of breath, cough or wheeze. GASTROINTESTINAL: No abdominal pain, nausea, vomiting or diarrhea, melena or bright red blood per rectum. GENITOURINARY: No urinary frequency, urgency, hesitancy or dysuria. MUSCULOSKELETAL: No joint or muscle pain, no back pain, no recent trauma. DERMATOLOGIC: No rash, no itching, no lesions. ENDOCRINE: No polyuria, polydipsia, no heat or cold intolerance. No recent change in weight. HEMATOLOGICAL: No anemia or easy bruising or bleeding. NEUROLOGIC: No headache,  seizures, numbness, tingling or weakness. PSYCHIATRIC: No depression, no loss of interest in normal activity or change in sleep pattern.   PE: Blood pressure 130/78   weight 208 pounds   BMI 33.07 Gen. appearance well-developed well-nourished seen with above-mentioned complaining Abdomen: Soft nontender no rebound or guarding Pelvic: Bartholin urethra Skene was within normal limits dried blood on patient's medial thighs were noted Vagina: Blood was present in the vaginal vault Cervix: Blood was noted at the cervical os but no active bleeding Uterus: Anteverted nontender no masses or tenderness Adnexa: No palpable masses or tenderness Rectal exam: Not done  Patient was counseled for an endometrial biopsy. A urine presented test was done in the office which was negative. The cervix was cleansed with Betadine solution. A sterile Pipelle was introduced into the uterine cavity and moderate amount of tissue was obtained submitted for histological evaluation.  Assessment/plan: 45 year old gravida 3 para 2 AB 1 with dysfunctional uterine bleeding will be prescribed Megace 40 mg twice a day for 10 days patient was then return to the office next week for sonohysterogram. Endometrial biopsy done today results pending at time of this dictation. A CBC will be checked today to compare with previous blood tests 3 weeks ago.   Assessment Plan:    Greater than 50% of time was spent in counseling and coordinating care of this patient.   Time of consultation: 30   Minutes.

## 2016-05-08 NOTE — Patient Instructions (Signed)

## 2016-05-08 NOTE — Addendum Note (Signed)
Addended by: Thurnell Garbe A on: 05/08/2016 03:11 PM   Modules accepted: Orders

## 2016-05-11 ENCOUNTER — Other Ambulatory Visit: Payer: Self-pay | Admitting: Gynecology

## 2016-05-11 DIAGNOSIS — N939 Abnormal uterine and vaginal bleeding, unspecified: Secondary | ICD-10-CM

## 2016-05-12 ENCOUNTER — Telehealth: Payer: Self-pay | Admitting: Gynecology

## 2016-05-12 NOTE — Telephone Encounter (Signed)
05/12/16-Pt was advised today that her [facility] insurance will cover the sonohysterogram and bx if needed under her $25 copay. Per [email or web address]

## 2016-05-19 ENCOUNTER — Encounter: Payer: Self-pay | Admitting: Gynecology

## 2016-05-19 ENCOUNTER — Ambulatory Visit (INDEPENDENT_AMBULATORY_CARE_PROVIDER_SITE_OTHER): Payer: Commercial Managed Care - PPO | Admitting: Gynecology

## 2016-05-19 ENCOUNTER — Ambulatory Visit (INDEPENDENT_AMBULATORY_CARE_PROVIDER_SITE_OTHER): Payer: Commercial Managed Care - PPO

## 2016-05-19 ENCOUNTER — Other Ambulatory Visit: Payer: Self-pay | Admitting: Gynecology

## 2016-05-19 DIAGNOSIS — N852 Hypertrophy of uterus: Secondary | ICD-10-CM

## 2016-05-19 DIAGNOSIS — R938 Abnormal findings on diagnostic imaging of other specified body structures: Secondary | ICD-10-CM | POA: Diagnosis not present

## 2016-05-19 DIAGNOSIS — N939 Abnormal uterine and vaginal bleeding, unspecified: Secondary | ICD-10-CM

## 2016-05-19 DIAGNOSIS — N8501 Benign endometrial hyperplasia: Secondary | ICD-10-CM

## 2016-05-19 DIAGNOSIS — D252 Subserosal leiomyoma of uterus: Secondary | ICD-10-CM

## 2016-05-19 DIAGNOSIS — N938 Other specified abnormal uterine and vaginal bleeding: Secondary | ICD-10-CM | POA: Diagnosis not present

## 2016-05-19 DIAGNOSIS — R9389 Abnormal findings on diagnostic imaging of other specified body structures: Secondary | ICD-10-CM

## 2016-05-19 DIAGNOSIS — D251 Intramural leiomyoma of uterus: Secondary | ICD-10-CM | POA: Insufficient documentation

## 2016-05-19 DIAGNOSIS — N9489 Other specified conditions associated with female genital organs and menstrual cycle: Secondary | ICD-10-CM

## 2016-05-19 DIAGNOSIS — N840 Polyp of corpus uteri: Secondary | ICD-10-CM

## 2016-05-19 NOTE — Progress Notes (Signed)
   Patient is a 45 year old gravida 3 para 2 AB 1 (1 elective AB, 1 vaginal delivery, one cesarean section) who was referred to our practice as a courtesy of Dr. Rita Ohara as a result of patient's recent dysfunction uterine bleeding on 05/08/2016. Patient on this last office visit had an endometrial biopsy which demonstrated the following:  Endometrium, biopsy, uterus - MIXED PHASE ENDOMETRIUM WITH PROMINENT BREAKDOWN ASSOCIATED WITH FRAGMENTS OF ENDOMETRIAL POLYP(S). - SEE COMMENT. Microscopic Comment Some of the polypoid fragments display areas of glandular crowding consistent with simple hyperplasia. There is no significant cytologic atypia or malignancy. Clinical correlation is recommended. (BNS:ds 05/11/16)  She was seen at the urgent care on July 1 reporting heavy menstrual bleeding whereby tampons and pad were being change periodically. She stated for the past several months her cycles has started to become irregular and she has not been using any form of contraception she was having some lightheadedness and dizziness during that time she thought it was vertigo. She had a CBC done at that office visit which demonstrated her white blood count was 9.3 and her hemoglobin hematocrit 13.1 and 30.2 respectively with a platelet count 303,000. Patient stated that prior to that visit she had not seen a gynecologist in over 10 years and reports no prior history of any abnormal Pap smears. When patient was in the urgent care she had been prescribed Estrace 1 tablet 4 times a day to stop her bleeding and then to take Provera which she took for 10 days. She was started on Megace 40 mg twice a day when I last saw her until a few days ago when she ran out.  Patient is using condoms for contraception.  Patient is here for sonohysterogram today to discuss the pathology report as described above.   Patient ultrasound and history the following: Uterus measured 10.6 x 7.4 x 6.3 cm with endometrial stripe of 17  mm prominent endometrium. Left mass appears to be extended from the wall of the uterus suggesting a subserous fibroid measuring 78 x 50 x 57 mm. Lower uterine feeder vessel was noted. Unable to identify the right or left ovary. The cervix was cleansed with Betadine solution and sterile catheter was introduced into the uterine cavity irregular endometrial wall was noted with several defects 14 x 7 mm, 10 x 7 mm, 9 x 8 mm, 11 x 9 mm   Assessment/plan: Dysfunction uterine bleeding attributed to endometrial polyps preliminary biopsy demonstrated benign endometrial polyps but also endometrial simple hyperplasia was noted. For this reason patient will be scheduled for an outpatient resectoscopic polypectomy D&C to submit for histological evaluation. The pathology report comes back simple hyperplasia we will start her on a progestational agent for 3 month time frame and then resample her endometrium. If the pathology reports demonstrates any atypicality I recommended that she proceeded with hysterectomy with bilateral salpingectomy and ovarian conservation.

## 2016-05-26 ENCOUNTER — Encounter (HOSPITAL_COMMUNITY): Payer: Self-pay

## 2016-05-27 ENCOUNTER — Encounter: Payer: Self-pay | Admitting: Anesthesiology

## 2016-05-27 ENCOUNTER — Telehealth: Payer: Self-pay | Admitting: *Deleted

## 2016-05-27 MED ORDER — MELOXICAM 15 MG PO TABS: 15.0000 mg | ORAL_TABLET | Freq: Every day | ORAL | 0 refills | Status: DC

## 2016-05-27 NOTE — Telephone Encounter (Signed)
Refill request Meloxicam received.  Dr. Berton Lan refill once needs an appt.

## 2016-06-10 ENCOUNTER — Ambulatory Visit (INDEPENDENT_AMBULATORY_CARE_PROVIDER_SITE_OTHER): Payer: Commercial Managed Care - PPO | Admitting: Gynecology

## 2016-06-10 ENCOUNTER — Encounter: Payer: Self-pay | Admitting: Gynecology

## 2016-06-10 VITALS — BP 128/82

## 2016-06-10 DIAGNOSIS — D252 Subserosal leiomyoma of uterus: Secondary | ICD-10-CM | POA: Diagnosis not present

## 2016-06-10 DIAGNOSIS — Z124 Encounter for screening for malignant neoplasm of cervix: Secondary | ICD-10-CM

## 2016-06-10 DIAGNOSIS — N840 Polyp of corpus uteri: Secondary | ICD-10-CM

## 2016-06-10 DIAGNOSIS — Z0000 Encounter for general adult medical examination without abnormal findings: Secondary | ICD-10-CM

## 2016-06-10 DIAGNOSIS — N938 Other specified abnormal uterine and vaginal bleeding: Secondary | ICD-10-CM

## 2016-06-10 DIAGNOSIS — Z01818 Encounter for other preprocedural examination: Secondary | ICD-10-CM | POA: Diagnosis not present

## 2016-06-10 MED ORDER — OXYCODONE-ACETAMINOPHEN 5-325 MG PO TABS: 1.0000 | ORAL_TABLET | ORAL | 0 refills | Status: DC | PRN

## 2016-06-10 MED ORDER — MEGESTROL ACETATE 40 MG PO TABS: 40.0000 mg | ORAL_TABLET | Freq: Two times a day (BID) | ORAL | 1 refills | Status: DC

## 2016-06-10 MED ORDER — METOCLOPRAMIDE HCL 10 MG PO TABS: 10.0000 mg | ORAL_TABLET | Freq: Three times a day (TID) | ORAL | 1 refills | Status: DC

## 2016-06-10 NOTE — Patient Instructions (Addendum)
Abdominal Hysterectomy Abdominal hysterectomy is a surgical procedure to remove your womb (uterus). Your uterus is the muscular organ that contains a developing baby. This surgery is done for many reasons. You may need an abdominal hysterectomy if you have cancer, growths (tumors), long-term pain, or bleeding. You may also have this procedure if your uterus has slipped down into your vagina (uterine prolapse). Depending on why you need an abdominal hysterectomy, you may also have other reproductive organs removed. These could include the part of your vagina that connects with your uterus (cervix), the organs that make eggs (ovaries), and the tubes that connect the ovaries to the uterus (fallopian tubes). LET YOUR HEALTH CARE PROVIDER KNOW ABOUT:   Any allergies you have.  All medicines you are taking, including vitamins, herbs, eye drops, creams, and over-the-counter medicines.  Previous problems you or members of your family have had with the use of anesthetics.  Any blood disorders you have.  Previous surgeries you have had.  Medical conditions you have. RISKS AND COMPLICATIONS Generally, this is a safe procedure. However, as with any procedure, problems can occur. Infection is the most common problem after an abdominal hysterectomy. Other possible problems include:  Bleeding.  Formation of blood clots that may break free and travel to your lungs.  Injury to other organs near your uterus.  Nerve injury causing nerve pain.  Decreased interest in sex or pain during sexual intercourse. BEFORE THE PROCEDURE  Abdominal hysterectomy is a major surgical procedure. It can affect the way you feel about yourself. Talk to your health care provider about the physical and emotional changes hysterectomy may cause.  You may need to have blood work and X-rays done before surgery.  Quit smoking if you smoke. Ask your health care provider for help if you are struggling to quit.  Stop taking  medicines that thin your blood as directed by your health care provider.  You may be instructed to take antibiotic medicines or laxatives before surgery.  Do not eat or drink anything for 6-8 hours before surgery.  Take your regular medicines with a small sip of water.  Bathe or shower the night or morning before surgery. PROCEDURE  Abdominal hysterectomy is done in the operating room at the hospital.  In most cases, you will be given a medicine that makes you go to sleep (general anesthetic).  The surgeon will make a cut (incision) through the skin in your lower belly.  The incision may be about 5-7 inches long. It may go side-to-side or up-and-down.  The surgeon will move aside the body tissue that covers your uterus. The surgeon will then carefully take out your uterus along with any of your other reproductive organs that need to be removed.  Bleeding will be controlled with clamps or sutures.  The surgeon will close your incision with sutures or metal clips. AFTER THE PROCEDURE  You will have some pain immediately after the procedure.  You will be given pain medicine in the recovery room.  You will be taken to your hospital room when you have recovered from the anesthesia.  You may need to stay in the hospital for 2-5 days.  You will be given instructions for recovery at home.   This information is not intended to replace advice given to you by your health care provider. Make sure you discuss any questions you have with your health care provider.   Document Released: 10/10/2013 Document Reviewed: 10/10/2013 Elsevier Interactive Patient Education 2016 Elsevier Inc. Uterine Fibroids   Uterine fibroids are tissue masses (tumors) that can develop in the womb (uterus). They are also called leiomyomas. This type of tumor is not cancerous (benign) and does not spread to other parts of the body outside of the pelvic area, which is between the hip bones. Occasionally, fibroids may  develop in the fallopian tubes, in the cervix, or on the support structures (ligaments) that surround the uterus. You can have one or many fibroids. Fibroids can vary in size, weight, and where they grow in the uterus. Some can become quite large. Most fibroids do not require medical treatment. CAUSES A fibroid can develop when a single uterine cell keeps growing (replicating). Most cells in the human body have a control mechanism that keeps them from replicating without control. SIGNS AND SYMPTOMS Symptoms may include:   Heavy bleeding during your period.  Bleeding or spotting between periods.  Pelvic pain and pressure.  Bladder problems, such as needing to urinate more often (urinary frequency) or urgently.  Inability to reproduce offspring (infertility).  Miscarriages. DIAGNOSIS Uterine fibroids are diagnosed through a physical exam. Your health care provider may feel the lumpy tumors during a pelvic exam. Ultrasonography and an MRI may be done to determine the size, location, and number of fibroids. TREATMENT Treatment may include:  Watchful waiting. This involves getting the fibroid checked by your health care provider to see if it grows or shrinks. Follow your health care provider's recommendations for how often to have this checked.  Hormone medicines. These can be taken by mouth or given through an intrauterine device (IUD).  Surgery.  Removing the fibroids (myomectomy) or the uterus (hysterectomy).  Removing blood supply to the fibroids (uterine artery embolization). If fibroids interfere with your fertility and you want to become pregnant, your health care provider may recommend having the fibroids removed.  HOME CARE INSTRUCTIONS  Keep all follow-up visits as directed by your health care provider. This is important.  Take medicines only as directed by your health care provider.  If you were prescribed a hormone treatment, take the hormone medicines exactly as  directed.  Do not take aspirin, because it can cause bleeding.  Ask your health care provider about taking iron pills and increasing the amount of dark green, leafy vegetables in your diet. These actions can help to boost your blood iron levels, which may be affected by heavy menstrual bleeding.  Pay close attention to your period and tell your health care provider about any changes, such as:  Increased blood flow that requires you to use more pads or tampons than usual per month.  A change in the number of days that your period lasts per month.  A change in symptoms that are associated with your period, such as abdominal cramping or back pain. SEEK MEDICAL CARE IF:  You have pelvic pain, back pain, or abdominal cramps that cannot be controlled with medicines.  You have an increase in bleeding between and during periods.  You soak tampons or pads in a half hour or less.  You feel lightheaded, extra tired, or weak. SEEK IMMEDIATE MEDICAL CARE IF:  You faint.  You have a sudden increase in pelvic pain.   This information is not intended to replace advice given to you by your health care provider. Make sure you discuss any questions you have with your health care provider.   Document Released: 10/02/2000 Document Revised: 10/26/2014 Document Reviewed: 04/03/2014 Elsevier Interactive Patient Education 2016 Elsevier Inc.  

## 2016-06-10 NOTE — Progress Notes (Signed)
Crystal Harris is an 45 y.o. female who is here for her preoperative examination consultation. Patient scheduled for surgery the second week of September. She originally was scheduled for resectoscopic polypectomy as a result of dysfunction uterine bleeding but after reading reviewing her medical record and with the size of this fibroid uterus in addition to the endometrial polyp in the pains that she's been having she will best be served with a total abdominal hysterectomy with bilateral salpingectomy. Her history is as follows:  Patient is a 45 year old gravida 3 para 2 AB 1 (1 elective AB, 1 vaginal delivery, one cesarean section) who was referred to our practice as a courtesy of Dr. Rita Harris as a result of patient's recent dysfunction uterine bleeding on 05/08/2016. Patient on this last office visit had an endometrial biopsy which demonstrated the following:  Endometrium, biopsy, uterus - MIXED PHASE ENDOMETRIUM WITH PROMINENT BREAKDOWN ASSOCIATED WITH FRAGMENTS OF ENDOMETRIAL POLYP(S). - SEE COMMENT. Microscopic Comment Some of the polypoid fragments display areas of glandular crowding consistent with simple hyperplasia. There is no significant cytologic atypia or malignancy. Clinical correlation is recommended. (BNS:ds 05/11/16)  She was seen at the urgent care on July 1 reporting heavy menstrual bleeding whereby tampons and pad were being change periodically. She stated for the past several months her cycles has started to become irregular and she has not been using any form of contraception she was having some lightheadedness and dizziness during that time she thought it was vertigo. She had a CBC done at that office visit which demonstrated her white blood count was 9.3 and her hemoglobin hematocrit 13.1 and 30.2 respectively with a platelet count 303,000. Patient stated that prior to that visit she had not seen a gynecologist in over 10 years and reports no prior history of any abnormal  Pap smears. When patient was in the urgent care she had been prescribed Estrace 1 tablet 4 times a day to stop her bleeding and then to take Provera which she took for 10 days. She was started on Megace 40 mg twice a day when I last saw her until a few days ago when she ran out.  Patient is using condoms for contraception.  Patient is here for sonohysterogram today to discuss the pathology report as described above.   Patient ultrasound and history the following: Uterus measured 10.6 x 7.4 x 6.3 cm with endometrial stripe of 17 mm prominent endometrium. Left mass appears to be extended from the wall of the uterus suggesting a subserous fibroid measuring 78 x 50 x 57 mm. Lower uterine feeder vessel was noted. Unable to identify the right or left ovary. The cervix was cleansed with Betadine solution and sterile catheter was introduced into the uterine cavity irregular endometrial wall was noted with several defects 14 x 7 mm, 10 x 7 mm, 9 x 8 mm, 11 x 9 mm  Pertinent Gynecological History: Menses: Dysfunctional uterine bleeding Bleeding: dysfunctional uterine bleeding Contraception: condoms DES exposure: denies Blood transfusions: none Sexually transmitted diseases: no past history Previous GYN Procedures: One cesarean section one vaginal delivery and one D any elective abortion  Last mammogram: normal Date: 2007 Last pap: normal Date: 2007 OB History: G 3, P2A1   Menstrual History: Menarche age: 71 Patient's last menstrual period was 04/18/2016.    Past Medical History:  Diagnosis Date  . Allergy   . Anxiety   . Arthritis    lower back, rupture disk L5, bulging disk L4  . Bronchitis   .  Chest congestion   . Diabetes mellitus without complication (HCC)    gestational diabetes  . GERD (gastroesophageal reflux disease)   . Headache    Migraines  . Heart murmur    mild during pregnancy  . Pneumonia   . Shortness of breath dyspnea   . Sinus congestion     Past Surgical  History:  Procedure Laterality Date  . BARTHOLIN CYST MARSUPIALIZATION    . CESAREAN SECTION    . WISDOM TOOTH EXTRACTION      Family History  Problem Relation Age of Onset  . Diabetes Mother   . Hyperlipidemia Mother   . Cancer Father   . Hypertension Father   . Cancer Sister   . Hypertension Sister   . Hyperlipidemia Brother     Social History:  reports that she has never smoked. She has never used smokeless tobacco. She reports that she drinks alcohol. She reports that she does not use drugs.  Allergies: No Known Allergies   (Not in a hospital admission)  REVIEW OF SYSTEMS: A ROS was performed and pertinent positives and negatives are included in the history.  GENERAL: No fevers or chills. HEENT: No change in vision, no earache, sore throat or sinus congestion. NECK: No pain or stiffness. CARDIOVASCULAR: No chest pain or pressure. No palpitations. PULMONARY: No shortness of breath, cough or wheeze. GASTROINTESTINAL: No abdominal pain, nausea, vomiting or diarrhea, melena or bright red blood per rectum. GENITOURINARY: No urinary frequency, urgency, hesitancy or dysuria. MUSCULOSKELETAL: No joint or muscle pain, no back pain, no recent trauma. DERMATOLOGIC: No rash, no itching, no lesions. ENDOCRINE: No polyuria, polydipsia, no heat or cold intolerance. No recent change in weight. HEMATOLOGICAL: No anemia or easy bruising or bleeding. NEUROLOGIC: No headache, seizures, numbness, tingling or weakness. PSYCHIATRIC: No depression, no loss of interest in normal activity or change in sleep pattern.     Blood pressure 128/82, last menstrual period 04/18/2016.  Physical Exam:  HEENT:unremarkable Neck:Supple, midline, no thyroid megaly, no carotid bruits Lungs:  Clear to auscultation no rhonchi's or wheezes Heart:Regular rate and rhythm, no murmurs or gallops Breast Exam: Both breasts were examined in the supine position there symmetrical in appearance no skin discoloration or nipple  inversion no supra clavicular axillary lymphadenopathy no nipple discharge Abdomen: Soft nontender Pfannenstiel scar seen Pelvic:BUS within normal limits Vagina: Dark blood in the vaginal vault Cervix: No gross lesions no active bleeding Uterus: 12 week size uterus with irregularity at the fundus to the patient's left Adnexa: As described above Extremities: No cords, no edema Rectal: Not examined  Overdue Pap smear done today. Patient was reminded to schedule her overdue mammogram as well.   Assessment/Plan: Patient with symptomatic leiomyomatous uteri and endometrial polyps will be scheduled for total abdominal hysterectomy with bilateral salpingectomy. Patient will continue her Megace 40 mg twice a day until the day of her surgery. Patient's last hemoglobin July 1 was 13.1. She also had a normal platelet count. She was given a prescription of Percocet 5/325 to take 1 by mouth every 4-6 hours when necessary pain postop and for postop nausea vomiting she was prescribe Reglan 10 mg to take 1 by mouth every 4-6 hours when necessary. The risk involved with the surgery that was discussed with the patient is as noted below:                        Patient was counseled as to the risk of surgery to include the following:  1. Infection (prohylactic antibiotics will be administered)  2. DVT/Pulmonary Embolism (prophylactic pneumo compression stockings will be used)  3.Trauma to internal organs requiring additional surgical procedure to repair any injury to     Internal organs requiring perhaps additional hospitalization days.  4.Hemmorhage requiring transfusion and blood products which carry risks such as  anaphylactic reaction, hepatitis and AIDS  Patient had received literature information on the procedure scheduled and all her questions were answered and fully accepts all risk.   [facility] HMD7:59 AMTD     Uvaldo Rising H 06/10/2016, 4:20 PM

## 2016-06-11 ENCOUNTER — Telehealth: Payer: Self-pay

## 2016-06-11 ENCOUNTER — Encounter: Payer: Self-pay | Admitting: Gynecology

## 2016-06-11 NOTE — Addendum Note (Signed)
Addended by: Thurnell Garbe A on: 06/11/2016 08:37 AM   Modules accepted: Orders

## 2016-06-11 NOTE — Telephone Encounter (Signed)
Patient saw Dr. Moshe Salisbury yesterday. She was originally scheduled for [facility] Hysteroscopy on a Friday at 1:00pm.  Dr. Moshe Salisbury now has changed surgery order to TAH,BSE. I  Explained to patient that our block time on Tuesdays allows time for major surgery as well as an assistant available. There is not time on that Friday nor an asst available for major surgery.  She understood. We discussed dates available with my first date 06/30/16.  She is going to talk with her work and let me know about scheduling. I will also call her if any cancellation in the next week or two as she had hoped to be back to work by Oct 1.  I will wait to hear from her.

## 2016-06-15 ENCOUNTER — Other Ambulatory Visit: Payer: Self-pay | Admitting: Gynecology

## 2016-06-15 LAB — PAP, TP IMAGING W/ HPV RNA, RFLX HPV TYPE 16,18/45: HPV MRNA, HIGH RISK: NOT DETECTED

## 2016-06-19 ENCOUNTER — Ambulatory Visit (HOSPITAL_COMMUNITY): Admission: RE | Admit: 2016-06-19 | Payer: PRIVATE HEALTH INSURANCE | Source: Ambulatory Visit | Admitting: Gynecology

## 2016-06-19 HISTORY — DX: Other specified symptoms and signs involving the circulatory and respiratory systems: R09.89

## 2016-06-19 HISTORY — DX: Headache: R51

## 2016-06-19 HISTORY — DX: Reserved for inherently not codable concepts without codable children: IMO0001

## 2016-06-19 HISTORY — DX: Headache, unspecified: R51.9

## 2016-06-19 HISTORY — DX: Pneumonia, unspecified organism: J18.9

## 2016-06-19 HISTORY — DX: Nasal congestion: R09.81

## 2016-06-19 HISTORY — DX: Gastro-esophageal reflux disease without esophagitis: K21.9

## 2016-06-19 HISTORY — DX: Bronchitis, not specified as acute or chronic: J40

## 2016-06-19 SURGERY — DILATATION & CURETTAGE/HYSTEROSCOPY WITH MYOSURE
Anesthesia: General

## 2016-07-03 ENCOUNTER — Ambulatory Visit: Payer: Commercial Managed Care - PPO | Admitting: Gynecology

## 2016-07-20 ENCOUNTER — Telehealth: Payer: Self-pay

## 2016-07-20 NOTE — Telephone Encounter (Signed)
I contacted patient because I was holding some time for her on 08/11/2016 and have not heard back  From her. She had needed to check with her job.  She said she definitely wants to proceed with this date and I will schedule her accordingly and front desk will call her to schedule pre op office visit. She will wait to hear from [facility] with instructions.

## 2016-07-24 ENCOUNTER — Encounter: Payer: Self-pay | Admitting: Anesthesiology

## 2016-07-28 DIAGNOSIS — Z0289 Encounter for other administrative examinations: Secondary | ICD-10-CM

## 2016-08-03 ENCOUNTER — Ambulatory Visit (INDEPENDENT_AMBULATORY_CARE_PROVIDER_SITE_OTHER): Payer: Commercial Managed Care - PPO | Admitting: Gynecology

## 2016-08-03 ENCOUNTER — Encounter: Payer: Self-pay | Admitting: Gynecology

## 2016-08-03 VITALS — BP 128/84

## 2016-08-03 DIAGNOSIS — N938 Other specified abnormal uterine and vaginal bleeding: Secondary | ICD-10-CM

## 2016-08-03 DIAGNOSIS — N840 Polyp of corpus uteri: Secondary | ICD-10-CM | POA: Diagnosis not present

## 2016-08-03 DIAGNOSIS — Z01818 Encounter for other preprocedural examination: Secondary | ICD-10-CM

## 2016-08-03 DIAGNOSIS — D251 Intramural leiomyoma of uterus: Secondary | ICD-10-CM | POA: Diagnosis not present

## 2016-08-03 NOTE — Patient Instructions (Signed)
Uterine Fibroids Uterine fibroids are tissue masses (tumors) that can develop in the womb (uterus). They are also called leiomyomas. This type of tumor is not cancerous (benign) and does not spread to other parts of the body outside of the pelvic area, which is between the hip bones. Occasionally, fibroids may develop in the fallopian tubes, in the cervix, or on the support structures (ligaments) that surround the uterus. You can have one or many fibroids. Fibroids can vary in size, weight, and where they grow in the uterus. Some can become quite large. Most fibroids do not require medical treatment. CAUSES A fibroid can develop when a single uterine cell keeps growing (replicating). Most cells in the human body have a control mechanism that keeps them from replicating without control. SIGNS AND SYMPTOMS Symptoms may include:   Heavy bleeding during your period.  Bleeding or spotting between periods.  Pelvic pain and pressure.  Bladder problems, such as needing to urinate more often (urinary frequency) or urgently.  Inability to reproduce offspring (infertility).  Miscarriages. DIAGNOSIS Uterine fibroids are diagnosed through a physical exam. Your health care provider may feel the lumpy tumors during a pelvic exam. Ultrasonography and an MRI may be done to determine the size, location, and number of fibroids. TREATMENT Treatment may include:  Watchful waiting. This involves getting the fibroid checked by your health care provider to see if it grows or shrinks. Follow your health care provider's recommendations for how often to have this checked.  Hormone medicines. These can be taken by mouth or given through an intrauterine device (IUD).  Surgery.  Removing the fibroids (myomectomy) or the uterus (hysterectomy).  Removing blood supply to the fibroids (uterine artery embolization). If fibroids interfere with your fertility and you want to become pregnant, your health care provider  may recommend having the fibroids removed.  HOME CARE INSTRUCTIONS  Keep all follow-up visits as directed by your health care provider. This is important.  Take medicines only as directed by your health care provider.  If you were prescribed a hormone treatment, take the hormone medicines exactly as directed.  Do not take aspirin, because it can cause bleeding.  Ask your health care provider about taking iron pills and increasing the amount of dark green, leafy vegetables in your diet. These actions can help to boost your blood iron levels, which may be affected by heavy menstrual bleeding.  Pay close attention to your period and tell your health care provider about any changes, such as:  Increased blood flow that requires you to use more pads or tampons than usual per month.  A change in the number of days that your period lasts per month.  A change in symptoms that are associated with your period, such as abdominal cramping or back pain. SEEK MEDICAL CARE IF:  You have pelvic pain, back pain, or abdominal cramps that cannot be controlled with medicines.  You have an increase in bleeding between and during periods.  You soak tampons or pads in a half hour or less.  You feel lightheaded, extra tired, or weak. SEEK IMMEDIATE MEDICAL CARE IF:  You faint.  You have a sudden increase in pelvic pain.   This information is not intended to replace advice given to you by your health care provider. Make sure you discuss any questions you have with your health care provider.   Document Released: 10/02/2000 Document Revised: 10/26/2014 Document Reviewed: 04/03/2014 Elsevier Interactive Patient Education 2016 [employer] Inc[employer] Abdominal Hysterectomy Abdominal hysterectomy is a surgical   procedure to remove your womb (uterus). Your uterus is the muscular organ that contains a developing baby. This surgery is done for many reasons. You may need an abdominal hysterectomy if you have cancer,  growths (tumors), long-term pain, or bleeding. You may also have this procedure if your uterus has slipped down into your vagina (uterine prolapse). Depending on why you need an abdominal hysterectomy, you may also have other reproductive organs removed. These could include the part of your vagina that connects with your uterus (cervix), the organs that make eggs (ovaries), and the tubes that connect the ovaries to the uterus (fallopian tubes). LET YOUR HEALTH CARE PROVIDER KNOW ABOUT:   Any allergies you have.  All medicines you are taking, including vitamins, herbs, eye drops, creams, and over-the-counter medicines.  Previous problems you or members of your family have had with the use of anesthetics.  Any blood disorders you have.  Previous surgeries you have had.  Medical conditions you have. RISKS AND COMPLICATIONS Generally, this is a safe procedure. However, as with any procedure, problems can occur. Infection is the most common problem after an abdominal hysterectomy. Other possible problems include:  Bleeding.  Formation of blood clots that may break free and travel to your lungs.  Injury to other organs near your uterus.  Nerve injury causing nerve pain.  Decreased interest in sex or pain during sexual intercourse. BEFORE THE PROCEDURE  Abdominal hysterectomy is a major surgical procedure. It can affect the way you feel about yourself. Talk to your health care provider about the physical and emotional changes hysterectomy may cause.  You may need to have blood work and X-rays done before surgery.  Quit smoking if you smoke. Ask your health care provider for help if you are struggling to quit.  Stop taking medicines that thin your blood as directed by your health care provider.  You may be instructed to take antibiotic medicines or laxatives before surgery.  Do not eat or drink anything for 6-8 hours before surgery.  Take your regular medicines with a small sip of  water.  Bathe or shower the night or morning before surgery. PROCEDURE  Abdominal hysterectomy is done in the operating room at the hospital.  In most cases, you will be given a medicine that makes you go to sleep (general anesthetic).  The surgeon will make a cut (incision) through the skin in your lower belly.  The incision may be about 5-7 inches long. It may go side-to-side or up-and-down.  The surgeon will move aside the body tissue that covers your uterus. The surgeon will then carefully take out your uterus along with any of your other reproductive organs that need to be removed.  Bleeding will be controlled with clamps or sutures.  The surgeon will close your incision with sutures or metal clips. AFTER THE PROCEDURE  You will have some pain immediately after the procedure.  You will be given pain medicine in the recovery room.  You will be taken to your hospital room when you have recovered from the anesthesia.  You may need to stay in the hospital for 2-5 days.  You will be given instructions for recovery at home.   This information is not intended to replace advice given to you by your health care provider. Make sure you discuss any questions you have with your health care provider.   Document Released: 10/10/2013 Document Reviewed: 10/10/2013 Elsevier Interactive Patient Education 2016 Elsevier Inc.  

## 2016-08-03 NOTE — Progress Notes (Signed)
Patient ID: Crystal Harris, female   DOB: 1971/03/17, 45 y.o.   MRN: [id number]  Patient presented to the office her preoperative examination. She was last seen back in the office in August and that postpone her surgery as a result of scheduling. Her history is as follows:  Patient is a 45 year old gravida 3 para 2 AB 1 (1 elective AB, 1 vaginal delivery, one cesarean section) who was referred to our practice as a courtesy of Dr. Rita Ohara as a result of patient's recent dysfunction uterine bleedingon 05/08/2016. Patient on this last office visit had an endometrial biopsy which demonstrated the following:  Endometrium, biopsy, uterus - MIXED PHASE ENDOMETRIUM WITH PROMINENT BREAKDOWN ASSOCIATED WITH FRAGMENTS OF ENDOMETRIAL POLYP(S). - SEE COMMENT. Microscopic Comment Some of the polypoid fragments display areas of glandular crowding consistent with simple hyperplasia. There is no significant cytologic atypia or malignancy. Clinical correlation is recommended  Patient is ultrasound and sonohysterogram demonstrated the following: Uterus measured 10.6 x 7.4 x 6.3 cm with endometrial stripe of 17 mm prominent endometrium. Left mass appears to be extended from the wall of the uterus suggesting a subserous fibroid measuring 78 x 50 x 57 mm. Lower uterine feeder vessel was noted. Unable to identify the right or left ovary. The cervix was cleansed with Betadine solution and sterile catheter was introduced into the uterine cavity irregular endometrial wall was noted with several defects 14 x 7 mm, 10 x 7 mm, 9 x 8 mm, 11 x 9 mm  She was seen at the urgent care on July 1 reporting heavy menstrual bleeding whereby tampons and pad were being change periodically. She stated for the past several months her cycles has started to become irregular and she has not been using any form of contraception she was having some lightheadedness and dizziness during that time she thought it was vertigo. She had a CBC done at  that office visit which demonstrated her white blood count was 9.3 and her hemoglobin hematocrit 13.1 and 30.2 respectively with a platelet count 303,000. Patient stated that prior to that visit she had not seen a gynecologist in over 10 years and reports no prior history of any abnormal Pap smears. When patient was in the urgent care she had been prescribed Estrace 1 tablet 4 times a day to stop her bleeding and then to take Provera which she took for 10 days. She was started on Megace 40 mg twice a day when I last saw her until a few days ago when she ran out.  Patient is using condoms for contraception.  She is now scheduled for her total abdominal hysterectomy bilateral salpingectomy in October 24. Pertinent Gynecological History: Menses: Dysfunctional uterine bleeding Bleeding: dysfunctional uterine bleeding Contraception: condoms DES exposure: denies Blood transfusions: none Sexually transmitted diseases: no past history Previous GYN Procedures: One cesarean section one vaginal delivery and one D any elective abortion  Last mammogram: normal Date: 2007 Last pap: normal Date: 2007 OB History: G 3, P2A1              Menstrual History: Menarche age: 48 Patient's last menstrual period was 04/18/2016.        Past Medical History:  Diagnosis Date  . Allergy   . Anxiety   . Arthritis    lower back, rupture disk L5, bulging disk L4  . Bronchitis   . Chest congestion   . Diabetes mellitus without complication (HCC)    gestational diabetes  . GERD (gastroesophageal reflux disease)   .  Headache    Migraines  . Heart murmur    mild during pregnancy  . Pneumonia   . Shortness of breath dyspnea   . Sinus congestion          Past Surgical History:  Procedure Laterality Date  . BARTHOLIN CYST MARSUPIALIZATION    . CESAREAN SECTION    . WISDOM TOOTH EXTRACTION           Family History  Problem Relation Age of Onset  . Diabetes Mother   .  Hyperlipidemia Mother   . Cancer Father   . Hypertension Father   . Cancer Sister   . Hypertension Sister   . Hyperlipidemia Brother     Social History:  reports that she has never smoked. She has never used smokeless tobacco. She reports that she drinks alcohol. She reports that she does not use drugs.  Allergies: No Known Allergies   (Not in a hospital admission)  REVIEW OF SYSTEMS: A ROS was performed and pertinent positives and negatives are included in the history.  GENERAL: No fevers or chills. HEENT: No change in vision, no earache, sore throat or sinus congestion. NECK: No pain or stiffness. CARDIOVASCULAR: No chest pain or pressure. No palpitations. PULMONARY: No shortness of breath, cough or wheeze. GASTROINTESTINAL: No abdominal pain, nausea, vomiting or diarrhea, melena or bright red blood per rectum. GENITOURINARY: No urinary frequency, urgency, hesitancy or dysuria. MUSCULOSKELETAL: No joint or muscle pain, no back pain, no recent trauma. DERMATOLOGIC: No rash, no itching, no lesions. ENDOCRINE: No polyuria, polydipsia, no heat or cold intolerance. No recent change in weight. HEMATOLOGICAL: No anemia or easy bruising or bleeding. NEUROLOGIC: No headache, seizures, numbness, tingling or weakness. PSYCHIATRIC: No depression, no loss of interest in normal activity or change in sleep pattern.     Blood pressure 128/82, last menstrual period 04/18/2016.  Physical Exam:  HEENT:unremarkable Neck:Supple, midline, no thyroid megaly, no carotid bruits Lungs:  Clear to auscultation no rhonchi's or wheezes Heart:Regular rate and rhythm, no murmurs or gallops Breast Exam: Both breasts were examined in the supine position there symmetrical in appearance no skin discoloration or nipple inversion no supra clavicular axillary lymphadenopathy no nipple discharge Abdomen: Soft nontender Pfannenstiel scar seen Pelvic:BUS within normal limits Vagina: Dark blood in the vaginal  vault Cervix: No gross lesions no active bleeding Uterus: 12 week size uterus with irregularity at the fundus to the patient's left Adnexa: As described above Extremities: No cords, no edema Rectal: Not examined  Assessment/Plan: Patient with symptomatic leiomyomatous uteri and endometrial polyps will be scheduled for total abdominal hysterectomy with bilateral salpingectomy. Patient will continue her Megace 40 mg twice a day until the day of her surgery. Patient's last hemoglobin July 1 was 13.1. She also had a normal platelet count. She was given a prescription of Percocet 5/325 to take 1 by mouth every 4-6 hours when necessary pain postop and for postop nausea vomiting she was prescribe Reglan 10 mg to take 1 by mouth every 4-6 hours when necessary. The risk involved with the surgery that was discussed with the patient is as noted below:                        Patient was counseled as to the risk of surgery to include the following:  1. Infection (prohylactic antibiotics will be administered)  2. DVT/Pulmonary Embolism (prophylactic pneumo compression stockings will be used)  3.Trauma to internal organs requiring additional surgical procedure to repair any  injury to     Internal organs requiring perhaps additional hospitalization days.  4.Hemmorhage requiring transfusion and blood products which carry risks such as  anaphylactic reaction, hepatitis and AIDS  Patient had received literature information on the procedure scheduled and all her questions were answered and fully accepts all risk.   [facility] HMD7:59 AMTD

## 2016-08-04 ENCOUNTER — Encounter (HOSPITAL_COMMUNITY): Payer: Self-pay

## 2016-08-04 ENCOUNTER — Encounter (HOSPITAL_COMMUNITY)
Admission: RE | Admit: 2016-08-04 | Discharge: 2016-08-04 | Disposition: A | Discharge status: Home / Self Care | Payer: Commercial Managed Care - PPO | Source: Ambulatory Visit | Attending: Gynecology | Admitting: Gynecology

## 2016-08-04 ENCOUNTER — Telehealth: Payer: Self-pay

## 2016-08-04 DIAGNOSIS — Z01810 Encounter for preprocedural cardiovascular examination: Secondary | ICD-10-CM | POA: Insufficient documentation

## 2016-08-04 LAB — CBC
HEMATOCRIT: 38.6 % (ref 36.0–46.0)
Hemoglobin: 12.4 g/dL (ref 12.0–15.0)
MCH: 24 pg — AB (ref 26.0–34.0)
MCHC: 32.1 g/dL (ref 30.0–36.0)
MCV: 74.8 fL — AB (ref 78.0–100.0)
PLATELETS: 389 10*3/uL (ref 150–400)
RBC: 5.16 MIL/uL — AB (ref 3.87–5.11)
RDW: 17.3 % — AB (ref 11.5–15.5)
WBC: 7 10*3/uL (ref 4.0–10.5)

## 2016-08-04 LAB — TYPE AND SCREEN
ABO/RH(D): A POS
Antibody Screen: NEGATIVE

## 2016-08-04 NOTE — Patient Instructions (Signed)
Your procedure is scheduled on:08/11/16  Enter through the Main Entrance at :7:30 am Pick up desk phone and dial [phone] and inform us of your arrival.  Please call [phone] if you have any problems the morning of surgery.  Remember: Do not eat food or drink liquids, including water, after midnight:Monday   You may brush your teeth the morning of surgery.  Take these meds the morning of surgery with a sip of water: Migraine med if needed,  Zyrtec  DO NOT wear jewelry, eye make-up, lipstick,body lotion, or dark fingernail polish.  (Polished toes are ok) You may wear deodorant.  If you are to be admitted after surgery, leave suitcase in car until your room has been assigned.  Wear loose fitting, comfortable clothes for your ride home.

## 2016-08-04 NOTE — Telephone Encounter (Signed)
08/11/16 

## 2016-08-04 NOTE — Telephone Encounter (Signed)
What day is the surgery?

## 2016-08-04 NOTE — Telephone Encounter (Signed)
I went over my surgical orders and clearly stated total abdominal hysterectomy with bilateral salpingectomy

## 2016-08-04 NOTE — Telephone Encounter (Signed)
Patient was in [place] at [facility] this morning for pre-op work up.  Your operative permit read TAH BSO but patient said it was supposed to be Bilateral Salpingectomy. Your office note reflects that.  Marcie Bal asked for you to go in Epic and revise your operative permit.

## 2016-08-04 NOTE — Pre-Procedure Instructions (Signed)
I LMOM for Crystal Harris - Surgery consent order is different from what is ordered by Dr. Toney Rakes.

## 2016-08-05 LAB — ABO/RH: ABO/RH(D): A POS

## 2016-08-05 NOTE — Telephone Encounter (Signed)
That fine. I told the nurse that. I told the patient that when she signs them on morning of surgery if not right that nurse will have to get you to correct them then.  I am just relaying to you what they are telling me.

## 2016-08-05 NOTE — Telephone Encounter (Signed)
I looked at my orders set and it read:  otal abdominal hysterectomy with bilateral salpingectomy this same as my notes. I can't do anything else you can help me corrected or they can help corrected when you can, and I can show you on my orders that I put total abdominal hysterectomy bilateral salpingectomy

## 2016-08-05 NOTE — Telephone Encounter (Signed)
Okay thank you

## 2016-08-05 NOTE — Telephone Encounter (Signed)
I called back to nurse and let her know you said you looked thought your orders and said procedure was correct.  She said that under "informed consent details" it still says "TAH, BSO."

## 2016-08-10 MED ORDER — DEXTROSE 5 % IV SOLN
2.0000 g | INTRAVENOUS | Status: AC
  Administered 2016-08-11: 2 g via INTRAVENOUS
  Filled 2016-08-10: qty 2

## 2016-08-10 NOTE — Anesthesia Preprocedure Evaluation (Signed)
Anesthesia Evaluation  Patient identified by MRN, date of birth, ID band Patient awake    Reviewed: Allergy & Precautions, NPO status , Patient's Chart, lab work & pertinent test results  Airway Mallampati: II  TM Distance: >3 FB Neck ROM: Full    Dental no notable dental hx.    Pulmonary shortness of breath, pneumonia,    Pulmonary exam normal breath sounds clear to auscultation       Cardiovascular negative cardio ROS Normal cardiovascular exam+ Valvular Problems/Murmurs  Rhythm:Regular Rate:Normal     Neuro/Psych  Headaches, negative psych ROS   GI/Hepatic negative GI ROS, Neg liver ROS,   Endo/Other  negative endocrine ROSdiabetes  Renal/GU negative Renal ROS     Musculoskeletal  (+) Arthritis ,   Abdominal (+) + obese,   Peds  Hematology negative hematology ROS (+)   Anesthesia Other Findings   Reproductive/Obstetrics negative OB ROS                            Anesthesia Physical Anesthesia Plan  ASA: II  Anesthesia Plan: General   Post-op Pain Management:    Induction: Intravenous  Airway Management Planned: Oral ETT  Additional Equipment:   Intra-op Plan:   Post-operative Plan: Extubation in OR  Informed Consent: I have reviewed the patients History and Physical, chart, labs and discussed the procedure including the risks, benefits and alternatives for the proposed anesthesia with the patient or authorized representative who has indicated his/her understanding and acceptance.   Dental advisory given  Plan Discussed with: CRNA  Anesthesia Plan Comments:         Anesthesia Quick Evaluation

## 2016-08-11 ENCOUNTER — Encounter (HOSPITAL_COMMUNITY): Payer: Self-pay | Admitting: *Deleted

## 2016-08-11 ENCOUNTER — Ambulatory Visit (HOSPITAL_COMMUNITY): Payer: Commercial Managed Care - PPO | Admitting: Anesthesiology

## 2016-08-11 ENCOUNTER — Inpatient Hospital Stay (HOSPITAL_COMMUNITY)
Admission: AD | Admit: 2016-08-11 | Discharge: 2016-08-12 | DRG: 743 | Disposition: A | Discharge status: Home / Self Care | Payer: Commercial Managed Care - PPO | Source: Ambulatory Visit | Attending: Gynecology | Admitting: Gynecology

## 2016-08-11 ENCOUNTER — Encounter (HOSPITAL_COMMUNITY)
Admission: AD | Disposition: A | Discharge status: Home / Self Care | Payer: Self-pay | Source: Ambulatory Visit | Attending: Gynecology

## 2016-08-11 DIAGNOSIS — D251 Intramural leiomyoma of uterus: Secondary | ICD-10-CM | POA: Diagnosis present

## 2016-08-11 DIAGNOSIS — D649 Anemia, unspecified: Secondary | ICD-10-CM | POA: Diagnosis present

## 2016-08-11 DIAGNOSIS — N938 Other specified abnormal uterine and vaginal bleeding: Secondary | ICD-10-CM | POA: Diagnosis present

## 2016-08-11 DIAGNOSIS — N840 Polyp of corpus uteri: Secondary | ICD-10-CM | POA: Diagnosis present

## 2016-08-11 DIAGNOSIS — Z9889 Other specified postprocedural states: Secondary | ICD-10-CM

## 2016-08-11 DIAGNOSIS — D259 Leiomyoma of uterus, unspecified: Secondary | ICD-10-CM

## 2016-08-11 DIAGNOSIS — N939 Abnormal uterine and vaginal bleeding, unspecified: Secondary | ICD-10-CM | POA: Diagnosis present

## 2016-08-11 HISTORY — PX: ABDOMINAL HYSTERECTOMY: SHX81

## 2016-08-11 HISTORY — PX: BILATERAL SALPINGECTOMY: SHX5743

## 2016-08-11 LAB — PREGNANCY, URINE: Preg Test, Ur: NEGATIVE

## 2016-08-11 SURGERY — HYSTERECTOMY, ABDOMINAL
Anesthesia: General | Site: Abdomen

## 2016-08-11 MED ORDER — LACTATED RINGERS IV SOLN
INTRAVENOUS | Status: DC
  Administered 2016-08-11 (×2): via INTRAVENOUS

## 2016-08-11 MED ORDER — LIDOCAINE HCL (CARDIAC) 20 MG/ML IV SOLN
INTRAVENOUS | Status: AC
  Filled 2016-08-11: qty 5

## 2016-08-11 MED ORDER — DEXAMETHASONE SODIUM PHOSPHATE 10 MG/ML IJ SOLN
INTRAMUSCULAR | Status: DC | PRN
  Administered 2016-08-11: 4 mg via INTRAVENOUS

## 2016-08-11 MED ORDER — BUPIVACAINE LIPOSOME 1.3 % IJ SUSP
20.0000 mL | Freq: Once | INTRAMUSCULAR | Status: DC
  Filled 2016-08-11: qty 20

## 2016-08-11 MED ORDER — BUPIVACAINE LIPOSOME 1.3 % IJ SUSP
INTRAMUSCULAR | Status: DC | PRN
  Administered 2016-08-11: 20 mL

## 2016-08-11 MED ORDER — SUGAMMADEX SODIUM 200 MG/2ML IV SOLN
INTRAVENOUS | Status: AC
  Filled 2016-08-11: qty 2

## 2016-08-11 MED ORDER — SCOPOLAMINE 1 MG/3DAYS TD PT72
1.0000 | MEDICATED_PATCH | Freq: Once | TRANSDERMAL | Status: DC
  Administered 2016-08-11: 1.5 mg via TRANSDERMAL

## 2016-08-11 MED ORDER — HYDROMORPHONE HCL 1 MG/ML IJ SOLN
INTRAMUSCULAR | Status: AC
  Filled 2016-08-11: qty 1

## 2016-08-11 MED ORDER — VASOPRESSIN 20 UNIT/ML IV SOLN
INTRAVENOUS | Status: AC
  Filled 2016-08-11: qty 1

## 2016-08-11 MED ORDER — FENTANYL CITRATE (PF) 250 MCG/5ML IJ SOLN
INTRAMUSCULAR | Status: AC
  Filled 2016-08-11: qty 5

## 2016-08-11 MED ORDER — SODIUM CHLORIDE 0.9% FLUSH: 9.0000 mL | INTRAVENOUS | Status: DC | PRN

## 2016-08-11 MED ORDER — PROPOFOL 10 MG/ML IV BOLUS
INTRAVENOUS | Status: DC | PRN
  Administered 2016-08-11: 200 mg via INTRAVENOUS

## 2016-08-11 MED ORDER — DEXAMETHASONE SODIUM PHOSPHATE 4 MG/ML IJ SOLN
INTRAMUSCULAR | Status: AC
  Filled 2016-08-11: qty 1

## 2016-08-11 MED ORDER — SODIUM CHLORIDE 0.9 % IJ SOLN
INTRAMUSCULAR | Status: AC
  Filled 2016-08-11: qty 100

## 2016-08-11 MED ORDER — MIDAZOLAM HCL 2 MG/2ML IJ SOLN
INTRAMUSCULAR | Status: DC | PRN
  Administered 2016-08-11: 2 mg via INTRAVENOUS

## 2016-08-11 MED ORDER — GLYCOPYRROLATE 0.2 MG/ML IJ SOLN
INTRAMUSCULAR | Status: AC
  Filled 2016-08-11: qty 1

## 2016-08-11 MED ORDER — MIDAZOLAM HCL 2 MG/2ML IJ SOLN
INTRAMUSCULAR | Status: AC
  Filled 2016-08-11: qty 2

## 2016-08-11 MED ORDER — LACTATED RINGERS IV SOLN
INTRAVENOUS | Status: DC
  Administered 2016-08-11 – 2016-08-12 (×2): via INTRAVENOUS

## 2016-08-11 MED ORDER — HYDROMORPHONE HCL 1 MG/ML IJ SOLN
0.2500 mg | INTRAMUSCULAR | Status: DC | PRN
  Administered 2016-08-11 (×2): 0.5 mg via INTRAVENOUS

## 2016-08-11 MED ORDER — DIPHENHYDRAMINE HCL 50 MG/ML IJ SOLN: 12.5000 mg | Freq: Four times a day (QID) | INTRAMUSCULAR | Status: DC | PRN

## 2016-08-11 MED ORDER — FENTANYL CITRATE (PF) 100 MCG/2ML IJ SOLN
INTRAMUSCULAR | Status: DC | PRN
  Administered 2016-08-11: 50 ug via INTRAVENOUS
  Administered 2016-08-11: 100 ug via INTRAVENOUS
  Administered 2016-08-11 (×2): 50 ug via INTRAVENOUS
  Administered 2016-08-11: 100 ug via INTRAVENOUS

## 2016-08-11 MED ORDER — OXYCODONE-ACETAMINOPHEN 5-325 MG PO TABS
1.0000 | ORAL_TABLET | ORAL | Status: DC | PRN
  Administered 2016-08-12 (×2): 1 via ORAL
  Filled 2016-08-11 (×2): qty 1

## 2016-08-11 MED ORDER — SCOPOLAMINE 1 MG/3DAYS TD PT72
MEDICATED_PATCH | TRANSDERMAL | Status: AC
  Filled 2016-08-11: qty 1

## 2016-08-11 MED ORDER — GLYCOPYRROLATE 0.2 MG/ML IJ SOLN
INTRAMUSCULAR | Status: DC | PRN
  Administered 2016-08-11: 0.2 mg via INTRAVENOUS

## 2016-08-11 MED ORDER — PROMETHAZINE HCL 25 MG/ML IJ SOLN: 6.2500 mg | INTRAMUSCULAR | Status: DC | PRN

## 2016-08-11 MED ORDER — SODIUM CHLORIDE 0.9 % IJ SOLN
INTRAMUSCULAR | Status: DC | PRN
  Administered 2016-08-11: 80 mL via INTRAVENOUS

## 2016-08-11 MED ORDER — DIPHENHYDRAMINE HCL 12.5 MG/5ML PO ELIX: 12.5000 mg | ORAL_SOLUTION | Freq: Four times a day (QID) | ORAL | Status: DC | PRN

## 2016-08-11 MED ORDER — MORPHINE SULFATE 2 MG/ML IV SOLN
INTRAVENOUS | Status: DC
  Administered 2016-08-11: 2 mg via INTRAVENOUS
  Administered 2016-08-11: 12:00:00 via INTRAVENOUS
  Administered 2016-08-12 (×2): 1 mg via INTRAVENOUS
  Filled 2016-08-11: qty 25

## 2016-08-11 MED ORDER — SODIUM CHLORIDE 0.9 % IV SOLN: 20.0000 mL | Freq: Once | INTRAVENOUS | Status: DC

## 2016-08-11 MED ORDER — ONDANSETRON HCL 4 MG/2ML IJ SOLN
INTRAMUSCULAR | Status: AC
  Filled 2016-08-11: qty 2

## 2016-08-11 MED ORDER — VASOPRESSIN 20 UNIT/ML IV SOLN
INTRAVENOUS | Status: DC | PRN
  Administered 2016-08-11: 20 [IU]

## 2016-08-11 MED ORDER — BUPIVACAINE HCL (PF) 0.25 % IJ SOLN
INTRAMUSCULAR | Status: DC | PRN
  Administered 2016-08-11: 30 mL

## 2016-08-11 MED ORDER — ONDANSETRON HCL 4 MG/2ML IJ SOLN
4.0000 mg | Freq: Four times a day (QID) | INTRAMUSCULAR | Status: DC | PRN
  Administered 2016-08-11: 4 mg via INTRAVENOUS
  Filled 2016-08-11: qty 2

## 2016-08-11 MED ORDER — PROPOFOL 10 MG/ML IV BOLUS
INTRAVENOUS | Status: AC
  Filled 2016-08-11: qty 20

## 2016-08-11 MED ORDER — KETOROLAC TROMETHAMINE 30 MG/ML IJ SOLN
INTRAMUSCULAR | Status: DC | PRN
  Administered 2016-08-11: 30 mg via INTRAVENOUS

## 2016-08-11 MED ORDER — LIDOCAINE HCL (CARDIAC) 20 MG/ML IV SOLN
INTRAVENOUS | Status: DC | PRN
  Administered 2016-08-11: 100 mg via INTRAVENOUS

## 2016-08-11 MED ORDER — ROCURONIUM BROMIDE 100 MG/10ML IV SOLN
INTRAVENOUS | Status: DC | PRN
  Administered 2016-08-11: 40 mg via INTRAVENOUS
  Administered 2016-08-11 (×3): 10 mg via INTRAVENOUS

## 2016-08-11 MED ORDER — MEPERIDINE HCL 25 MG/ML IJ SOLN: 6.2500 mg | INTRAMUSCULAR | Status: DC | PRN

## 2016-08-11 MED ORDER — NALOXONE HCL 0.4 MG/ML IJ SOLN: 0.4000 mg | INTRAMUSCULAR | Status: DC | PRN

## 2016-08-11 MED ORDER — ROCURONIUM BROMIDE 100 MG/10ML IV SOLN
INTRAVENOUS | Status: AC
  Filled 2016-08-11: qty 1

## 2016-08-11 MED ORDER — ONDANSETRON HCL 4 MG/2ML IJ SOLN
INTRAMUSCULAR | Status: DC | PRN
  Administered 2016-08-11: 4 mg via INTRAVENOUS

## 2016-08-11 MED ORDER — SUGAMMADEX SODIUM 200 MG/2ML IV SOLN
INTRAVENOUS | Status: DC | PRN
  Administered 2016-08-11: 185 mg via INTRAVENOUS

## 2016-08-11 MED ORDER — BUPIVACAINE HCL (PF) 0.25 % IJ SOLN
INTRAMUSCULAR | Status: AC
  Filled 2016-08-11: qty 30

## 2016-08-11 SURGICAL SUPPLY — 47 items
BARRIER ADHS 3X4 INTERCEED (GAUZE/BANDAGES/DRESSINGS) IMPLANT
CANISTER SUCT 3000ML (MISCELLANEOUS) ×4 IMPLANT
CLOSURE WOUND 1/2 X4 (GAUZE/BANDAGES/DRESSINGS)
CLOTH BEACON ORANGE TIMEOUT ST (SAFETY) ×4 IMPLANT
DECANTER SPIKE VIAL GLASS SM (MISCELLANEOUS) ×4 IMPLANT
DRAPE CESAREAN BIRTH W POUCH (DRAPES) ×4 IMPLANT
DRAPE WARM FLUID 44X44 (DRAPE) ×4 IMPLANT
DRSG OPSITE POSTOP 4X10 (GAUZE/BANDAGES/DRESSINGS) ×4 IMPLANT
DRSG XEROFORM 1X8 (GAUZE/BANDAGES/DRESSINGS) ×4 IMPLANT
DURAPREP 26ML APPLICATOR (WOUND CARE) ×4 IMPLANT
GAUZE SPONGE 4X4 16PLY XRAY LF (GAUZE/BANDAGES/DRESSINGS) IMPLANT
GLOVE BIOGEL PI IND STRL 7.0 (GLOVE) ×6 IMPLANT
GLOVE BIOGEL PI IND STRL 8 (GLOVE) ×2 IMPLANT
GLOVE BIOGEL PI INDICATOR 7.0 (GLOVE) ×6
GLOVE BIOGEL PI INDICATOR 8 (GLOVE) ×2
GLOVE ECLIPSE 7.5 STRL STRAW (GLOVE) ×8 IMPLANT
GOWN STRL REUS W/TWL LRG LVL3 (GOWN DISPOSABLE) ×12 IMPLANT
HEMOSTAT ARISTA ABSORB 3G PWDR (MISCELLANEOUS) ×4 IMPLANT
NEEDLE HYPO 22GX1.5 SAFETY (NEEDLE) ×4 IMPLANT
NS IRRIG 1000ML POUR BTL (IV SOLUTION) ×8 IMPLANT
PACK ABDOMINAL GYN (CUSTOM PROCEDURE TRAY) ×4 IMPLANT
PAD OB MATERNITY 4.3X12.25 (PERSONAL CARE ITEMS) ×4 IMPLANT
PROTECTOR NERVE ULNAR (MISCELLANEOUS) ×4 IMPLANT
RETRACTOR WND ALEXIS 25 LRG (MISCELLANEOUS) IMPLANT
RTRCTR WOUND ALEXIS 25CM LRG (MISCELLANEOUS)
SPONGE GAUZE 4X4 12PLY STER LF (GAUZE/BANDAGES/DRESSINGS) ×8 IMPLANT
SPONGE LAP 18X18 X RAY DECT (DISPOSABLE) ×4 IMPLANT
STAPLER VISISTAT 35W (STAPLE) IMPLANT
STRIP CLOSURE SKIN 1/2X4 (GAUZE/BANDAGES/DRESSINGS) IMPLANT
SUT CHROMIC 3 0 SH 27 (SUTURE) IMPLANT
SUT VIC AB 0 CT1 18XCR BRD8 (SUTURE) ×6 IMPLANT
SUT VIC AB 0 CT1 36 (SUTURE) IMPLANT
SUT VIC AB 0 CT1 8-18 (SUTURE) ×6
SUT VIC AB 1 CT1 18XBRD ANBCTR (SUTURE) IMPLANT
SUT VIC AB 1 CT1 8-18 (SUTURE)
SUT VIC AB 3-0 CT1 27 (SUTURE) ×4
SUT VIC AB 3-0 CT1 TAPERPNT 27 (SUTURE) ×4 IMPLANT
SUT VIC AB 3-0 SH 27 (SUTURE) ×2
SUT VIC AB 3-0 SH 27X BRD (SUTURE) ×2 IMPLANT
SUT VICRYL 0 TIES 12 18 (SUTURE) ×4 IMPLANT
SUT VICRYL 3 0 BR 18  UND (SUTURE)
SUT VICRYL 3 0 BR 18 UND (SUTURE) IMPLANT
SYR CONTROL 10ML LL (SYRINGE) ×4 IMPLANT
TOWEL OR 17X24 6PK STRL BLUE (TOWEL DISPOSABLE) ×8 IMPLANT
TRAY FOLEY CATH SILVER 14FR (SET/KITS/TRAYS/PACK) ×4 IMPLANT
WATER STERILE IRR 1000ML POUR (IV SOLUTION) IMPLANT
YANKAUER SUCT BULB TIP NO VENT (SUCTIONS) ×4 IMPLANT

## 2016-08-11 NOTE — Addendum Note (Signed)
Addendum  created 08/11/16 1654 by Hewitt Blade, CRNA   Sign clinical note

## 2016-08-11 NOTE — H&P (View-Only) (Signed)
Patient ID: MERVA MIRACLE, female   DOB: [birth year], 45 y.o.   MRN: [id number]  Patient presented to the office her preoperative examination. She was last seen back in the office in August and that postpone her surgery as a result of scheduling. Her history is as follows:  Patient is a 45 year old gravida 3 para 2 AB 1 (1 elective AB, 1 vaginal delivery, one cesarean section) who was referred to our practice as a courtesy of Dr. Rita Ohara as a result of patient's recent dysfunction uterine bleedingon 05/08/2016. Patient on this last office visit had an endometrial biopsy which demonstrated the following:  Endometrium, biopsy, uterus - MIXED PHASE ENDOMETRIUM WITH PROMINENT BREAKDOWN ASSOCIATED WITH FRAGMENTS OF ENDOMETRIAL POLYP(S). - SEE COMMENT. Microscopic Comment Some of the polypoid fragments display areas of glandular crowding consistent with simple hyperplasia. There is no significant cytologic atypia or malignancy. Clinical correlation is recommended  Patient is ultrasound and sonohysterogram demonstrated the following: Uterus measured 10.6 x 7.4 x 6.3 cm with endometrial stripe of 17 mm prominent endometrium. Left mass appears to be extended from the wall of the uterus suggesting a subserous fibroid measuring 78 x 50 x 57 mm. Lower uterine feeder vessel was noted. Unable to identify the right or left ovary. The cervix was cleansed with Betadine solution and sterile catheter was introduced into the uterine cavity irregular endometrial wall was noted with several defects 14 x 7 mm, 10 x 7 mm, 9 x 8 mm, 11 x 9 mm  She was seen at the urgent care on July 1 reporting heavy menstrual bleeding whereby tampons and pad were being change periodically. She stated for the past several months her cycles has started to become irregular and she has not been using any form of contraception she was having some lightheadedness and dizziness during that time she thought it was vertigo. She had a CBC done at  that office visit which demonstrated her white blood count was 9.3 and her hemoglobin hematocrit 13.1 and 30.2 respectively with a platelet count 303,000. Patient stated that prior to that visit she had not seen a gynecologist in over 10 years and reports no prior history of any abnormal Pap smears. When patient was in the urgent care she had been prescribed Estrace 1 tablet 4 times a day to stop her bleeding and then to take Provera which she took for 10 days. She was started on Megace 40 mg twice a day when I last saw her until a few days ago when she ran out.  Patient is using condoms for contraception.  She is now scheduled for her total abdominal hysterectomy bilateral salpingectomy in October 24. Pertinent Gynecological History: Menses: Dysfunctional uterine bleeding Bleeding: dysfunctional uterine bleeding Contraception: condoms DES exposure: denies Blood transfusions: none Sexually transmitted diseases: no past history Previous GYN Procedures: One cesarean section one vaginal delivery and one D any elective abortion  Last mammogram: normal Date: 2007 Last pap: normal Date: 2007 OB History: G 3, P2A1              Menstrual History: Menarche age: 45 Patient's last menstrual period was 04/18/2016.        Past Medical History:  Diagnosis Date  . Allergy   . Anxiety   . Arthritis    lower back, rupture disk L5, bulging disk L4  . Bronchitis   . Chest congestion   . Diabetes mellitus without complication (HCC)    gestational diabetes  . GERD (gastroesophageal reflux disease)   .  Headache    Migraines  . Heart murmur    mild during pregnancy  . Pneumonia   . Shortness of breath dyspnea   . Sinus congestion          Past Surgical History:  Procedure Laterality Date  . BARTHOLIN CYST MARSUPIALIZATION    . CESAREAN SECTION    . WISDOM TOOTH EXTRACTION           Family History  Problem Relation Age of Onset  . Diabetes Mother   .  Hyperlipidemia Mother   . Cancer Father   . Hypertension Father   . Cancer Sister   . Hypertension Sister   . Hyperlipidemia Brother     Social History:  reports that she has never smoked. She has never used smokeless tobacco. She reports that she drinks alcohol. She reports that she does not use drugs.  Allergies: No Known Allergies   (Not in a hospital admission)  REVIEW OF SYSTEMS: A ROS was performed and pertinent positives and negatives are included in the history.  GENERAL: No fevers or chills. HEENT: No change in vision, no earache, sore throat or sinus congestion. NECK: No pain or stiffness. CARDIOVASCULAR: No chest pain or pressure. No palpitations. PULMONARY: No shortness of breath, cough or wheeze. GASTROINTESTINAL: No abdominal pain, nausea, vomiting or diarrhea, melena or bright red blood per rectum. GENITOURINARY: No urinary frequency, urgency, hesitancy or dysuria. MUSCULOSKELETAL: No joint or muscle pain, no back pain, no recent trauma. DERMATOLOGIC: No rash, no itching, no lesions. ENDOCRINE: No polyuria, polydipsia, no heat or cold intolerance. No recent change in weight. HEMATOLOGICAL: No anemia or easy bruising or bleeding. NEUROLOGIC: No headache, seizures, numbness, tingling or weakness. PSYCHIATRIC: No depression, no loss of interest in normal activity or change in sleep pattern.     Blood pressure 128/82, last menstrual period 04/18/2016.  Physical Exam:  HEENT:unremarkable Neck:Supple, midline, no thyroid megaly, no carotid bruits Lungs:  Clear to auscultation no rhonchi's or wheezes Heart:Regular rate and rhythm, no murmurs or gallops Breast Exam: Both breasts were examined in the supine position there symmetrical in appearance no skin discoloration or nipple inversion no supra clavicular axillary lymphadenopathy no nipple discharge Abdomen: Soft nontender Pfannenstiel scar seen Pelvic:BUS within normal limits Vagina: Dark blood in the vaginal  vault Cervix: No gross lesions no active bleeding Uterus: 12 week size uterus with irregularity at the fundus to the patient's left Adnexa: As described above Extremities: No cords, no edema Rectal: Not examined  Assessment/Plan: Patient with symptomatic leiomyomatous uteri and endometrial polyps will be scheduled for total abdominal hysterectomy with bilateral salpingectomy. Patient will continue her Megace 40 mg twice a day until the day of her surgery. Patient's last hemoglobin July 1 was 13.1. She also had a normal platelet count. She was given a prescription of Percocet 5/325 to take 1 by mouth every 4-6 hours when necessary pain postop and for postop nausea vomiting she was prescribe Reglan 10 mg to take 1 by mouth every 4-6 hours when necessary. The risk involved with the surgery that was discussed with the patient is as noted below:                        Patient was counseled as to the risk of surgery to include the following:  1. Infection (prohylactic antibiotics will be administered)  2. DVT/Pulmonary Embolism (prophylactic pneumo compression stockings will be used)  3.Trauma to internal organs requiring additional surgical procedure to repair any  injury to     Internal organs requiring perhaps additional hospitalization days.  4.Hemmorhage requiring transfusion and blood products which carry risks such as  anaphylactic reaction, hepatitis and AIDS  Patient had received literature information on the procedure scheduled and all her questions were answered and fully accepts all risk.   [facility] HMD7:59 AMTD

## 2016-08-11 NOTE — Anesthesia Procedure Notes (Signed)
Procedure Name: Intubation Date/Time: 08/11/2016 7:35 AM Performed by: Hewitt Blade Pre-anesthesia Checklist: Patient identified, Emergency Drugs available, Suction available, Patient being monitored and Timeout performed Patient Re-evaluated:Patient Re-evaluated prior to inductionOxygen Delivery Method: Circle system utilized Preoxygenation: Pre-oxygenation with 100% oxygen Intubation Type: IV induction Ventilation: Mask ventilation without difficulty Laryngoscope Size: Mac and 3 Grade View: Grade I Tube type: Oral Tube size: 7.0 mm Number of attempts: 1 Airway Equipment and Method: Stylet Placement Confirmation: ETT inserted through vocal cords under direct vision,  positive ETCO2 and breath sounds checked- equal and bilateral Secured at: 21 cm Tube secured with: Tape Dental Injury: Teeth and Oropharynx as per pre-operative assessment

## 2016-08-11 NOTE — Anesthesia Postprocedure Evaluation (Signed)
Anesthesia Post Note  Patient: Crystal Harris  Procedure(s) Performed: Procedure(s) (LRB): HYSTERECTOMY ABDOMINAL (N/A) BILATERAL SALPINGECTOMY (Bilateral)  Patient location during evaluation: PACU Anesthesia Type: General Level of consciousness: sedated and patient cooperative Pain management: pain level controlled Vital Signs Assessment: post-procedure vital signs reviewed and stable Respiratory status: spontaneous breathing Cardiovascular status: stable Anesthetic complications: no     Last Vitals:  Vitals:   08/11/16 1204 08/11/16 1300  BP: (!) 113/57 (!) 108/57  Pulse:  86  Resp: 20   Temp: 36.5 C 36.7 C    Last Pain:  Vitals:   08/11/16 1217  TempSrc:   PainSc: 2    Pain Goal: Patients Stated Pain Goal: 2 (08/11/16 1217)               Nolon Nations

## 2016-08-11 NOTE — Transfer of Care (Signed)
Immediate Anesthesia Transfer of Care Note  Patient: Crystal Harris  Procedure(s) Performed: Procedure(s): HYSTERECTOMY ABDOMINAL (N/A) BILATERAL SALPINGECTOMY (Bilateral)  Patient Location: PACU  Anesthesia Type:General  Level of Consciousness: awake, alert  and oriented  Airway & Oxygen Therapy: Patient Spontanous Breathing and Patient connected to nasal cannula oxygen  Post-op Assessment: Report given to RN, Post -op Vital signs reviewed and stable and Patient moving all extremities  Post vital signs: Reviewed and stable  Last Vitals:  Vitals:   08/11/16 0615  BP: 106/78  Resp: 18  Temp: 36.8 C    Last Pain:  Vitals:   08/11/16 0615  TempSrc: Oral      Patients Stated Pain Goal: 3 ([id number])  Complications: No apparent anesthesia complications

## 2016-08-11 NOTE — Interval H&P Note (Signed)
History and Physical Interval Note:  08/11/2016 7:03 AM  Crystal Harris  has presented today for surgery, with the diagnosis of Dysfunctional uterine bleeding, endometrial polyp, anemia  The various methods of treatment have been discussed with the patient and family. After consideration of risks, benefits and other options for treatment, the patient has consented to  Procedure(s): HYSTERECTOMY ABDOMINAL (N/A) BILATERAL SALPINGECTOMY (Bilateral) as a surgical intervention .  The patient's history has been reviewed, patient examined, no change in status, stable for surgery.  I have reviewed the patient's chart and labs.  Questions were answered to the patient's satisfaction.     Terrance Mass

## 2016-08-11 NOTE — Op Note (Signed)
Operative Note  08/11/2016  10:11 AM  PATIENT:  Crystal Harris  45 y.o. female  PRE-OPERATIVE DIAGNOSIS:  Dysfunctional uterine bleeding, fibroid uterus, anemia  POST-OPERATIVE DIAGNOSIS:  dysfunctional uterine bleeding, fibroid uterus, anemia  PROCEDURE:  Procedure(s): HYSTERECTOMY ABDOMINAL BILATERAL SALPINGECTOMY  SURGEON:  Surgeon(s): Terrance Mass, MD Anastasio Auerbach, MD  ANESTHESIA:   general  FINDINGS:7-1/2 cm intramural leiomyoma normal fallopian tubes normal appearing ovaries pelvic adhesions from previous C-section   DESCRIPTION OF OPERATION:The patient was taken to the operating room and was placed in a dorsal supine position. She was then placed under general anesthesia and intubated. A time out was undertaken to correctly identify the patient and procedure to be undertaken. She was then examined under anesthesia with irregularity on patient's left side of the uterus from fibroid.. The procedure was begun by performing a Pfannenstiel skin Incision with the first knife. The incision was extended to the level of the fascia using Bovie coagulation. The fascia was scored bilaterally at the midline. The fascial incision was then extended in a curvilinear fashion using the Mayo scissors. The fascia was dissected from the muscular area below by both sharp and blunt dissection. The muscular area was dissected along the midline by both sharp and blunt dissection. The peritoneum was subsequently entered. The incision was then extended in a vertical fashion using Metzenbaum scissors. Immediate notation was made of the large 7 to have centimeter subserosal myoma on the left side of the uterus along with pelvic adhesions. The right And left ovary were within normal limits.A self-retaining O'Connor-O'Sullivan retractor was placed into the abdomen. The abdominal contents were packed in the upper abdomen using 2 clean wet laps. Two large Kelly clamps were placed above the cornual  portion of the uterus, and the hysterectomy was begun by transecting the round ligaments, which were subsequently suture ligated with 0 Vicryl suture and tag. Uterovesical peritoneum was subsequently incised with the Metzenbaum scissors, and incision was extended to the sidewall on both side allowing dissection to the sidewall and subsequent visualization of the uterus. Once the uterus was felt free, the infundibulopelvic ligaments were then grasped with 2 curved Haney clamps, subsequently cut and the pedicle was initially free tied with 0 Vicryl and subsequently suture ligated with 0 Vicryl suture leaving both tubes and ovaries behind. Attention was then turned to the round ligament, which was also secured in the previous fashion. The uterine vessels were subsequently skeletonized, subsequently grasped with curved Heaney clamps, cut and suture ligated with 0 Vicryl suture. Cardinal ligaments on either side were subsequently grasped, cut and suture ligated with 0 Vicryl suture to the level of the uterosacral ligaments, which were then grasped with curved Haney clamps, cut and suture ligated with 0 Vicryl suture. These sutures were left long and tagged with hemostats. Subsequently, 2 large clamps were placed about the upper vaginal cuff. The specimen was subsequently dissected free with the Jorgenson scissors, and the pedicles were suture ligated with 0 Vicryl suture. before closure the vaginal cuff attention was placed to each individual fallopian tube which was placed under tension with a Babcock clamp and with a Kelly placed on the mesosalpinx each fallopian tube was excised and passed off the operative field the pedicles were secured with 0 Vicryl suture.The vaginal cuff was also closed with 0 Vicryl suture in an interrupted fashion. Subsequently, the pelvis was irrigated until clear. Points of bleeding were secured either by Bovie coagulation or by suture ligature with a 0 Vicryl suture.  Once hemostasis was  achieved  For additional hemostasis Arista hemostatic patient was placed over the raw surfaces. The O'Connor-O'Sullivan retractor was subsequently removed. Laps were subsequently removed and the abdominal wall was closed in the following fashion. The fascial layers were approximated with 0 Vicryl with sutures coming from the closing angles interlocking every third towards the midline. The subcutaneous layer was reapproximated with 3-0 Vicryl suture. The skin edges were reapproximated with staples. For postoperative analgesia Exparel 1.3% was infiltrated at the level of the peritoneum, fascia and subcutaneous for a total of approximately 40 cc.The patient tolerated the procedure well. The uterus and cervix were passed off the operative field along with both fallopian tubesfor histological evaluation. The patient was extubated and transferred to recovery room stable vital signs.       ESTIMATED BLOOD LOSS: 550 cc   Intake/Output Summary (Last 24 hours) at 08/11/16 1011 Last data filed at 08/11/16 0939  Gross per 24 hour  Intake             3000 ml  Output             1450 ml  Net             1550 ml     BLOOD ADMINISTERED:none   LOCAL MEDICATIONS USED:  OTHER Exparel  SPECIMEN:  Source of Specimen:  Uterus, cervix bilateral fallopian tube  DISPOSITION OF SPECIMEN:  PATHOLOGY  COUNTS:  YES  PLAN OF CARE: Transfer to PACU  [facility] HMD10:11 AMTD@

## 2016-08-11 NOTE — Anesthesia Postprocedure Evaluation (Signed)
Anesthesia Post Note  Patient: Crystal Harris  Procedure(s) Performed: Procedure(s) (LRB): HYSTERECTOMY ABDOMINAL (N/A) BILATERAL SALPINGECTOMY (Bilateral)  Patient location during evaluation: [facility] Anesthesia Type: General Level of consciousness: awake and alert Pain management: pain level controlled Vital Signs Assessment: post-procedure vital signs reviewed and stable Respiratory status: spontaneous breathing and nonlabored ventilation Cardiovascular status: stable Postop Assessment: adequate PO intake and no signs of nausea or vomiting Anesthetic complications: no     Last Vitals:  Vitals:   08/11/16 1459 08/11/16 1600  BP: 114/63 109/63  Pulse: 70 81  Resp:  18  Temp:  36.4 C    Last Pain:  Vitals:   08/11/16 1600  TempSrc: Axillary  PainSc:    Pain Goal: Patients Stated Pain Goal: 2 (08/11/16 1530)               France Ravens Hristova

## 2016-08-12 ENCOUNTER — Encounter (HOSPITAL_COMMUNITY): Payer: Self-pay | Admitting: Gynecology

## 2016-08-12 LAB — CBC
HEMATOCRIT: 30.7 % — AB (ref 36.0–46.0)
HEMOGLOBIN: 9.7 g/dL — AB (ref 12.0–15.0)
MCH: 23.6 pg — ABNORMAL LOW (ref 26.0–34.0)
MCHC: 31.6 g/dL (ref 30.0–36.0)
MCV: 74.7 fL — AB (ref 78.0–100.0)
Platelets: 356 10*3/uL (ref 150–400)
RBC: 4.11 MIL/uL (ref 3.87–5.11)
RDW: 17.2 % — AB (ref 11.5–15.5)
WBC: 11.3 10*3/uL — AB (ref 4.0–10.5)

## 2016-08-12 MED ORDER — METOCLOPRAMIDE HCL 10 MG PO TABS: 10.0000 mg | ORAL_TABLET | Freq: Four times a day (QID) | ORAL | Status: DC | PRN

## 2016-08-12 NOTE — Progress Notes (Signed)
1 Day Post-Op Procedure(s) (LRB): HYSTERECTOMY ABDOMINAL (N/A) BILATERAL SALPINGECTOMY (Bilateral)  Subjective: Patient reports tolerating PO, + flatus and no problems voiding.    Objective: I have reviewed patient's vital signs, intake and output, medications and labs.  General: alert and cooperative Resp: clear to auscultation bilaterally Cardio: regular rate and rhythm, S1, S2 normal, no murmur, click, rub or gallop GI: soft, non-tender; bowel sounds normal; no masses,  no organomegaly and incision: clean Extremities: extremities normal, atraumatic, no cyanosis or edema Vaginal Bleeding: minimal   Preoperative hemoglobin 12.4 postop hemoglobin 9.7  Assessment: s/p Procedure(s): HYSTERECTOMY ABDOMINAL (N/A) BILATERAL SALPINGECTOMY (Bilateral): stable, progressing well and tolerating diet  Plan: Advance diet Encourage ambulation Advance to PO medication Discontinue IV fluids Plan discharge home later this evening after rounds if continues to progress well  LOS: 1 day    [facility] H 08/12/2016, 8:21 AM

## 2016-08-12 NOTE — Discharge Summary (Signed)
Physician Discharge Summary  Patient ID: Crystal Harris MRN: [id number] DOB/AGE: 47-14-1972 47 y.o.  Admit date: 08/11/2016 Discharge date: 08/12/2016  Admission Diagnoses:Symptomatic leiomyomatous uteri  Discharge Diagnoses: Symptomatic leiomyomatous uteri Active Problems:   Postoperative state   Discharged Condition: good  Hospital Course: Patient was admitted to the hospital on October 24 where she underwent a total abdominal hysterectomy with bilateral salpingectomy as a result of symptomatic leiomyomatous uteri. Patient lost approximately 500 cc blood. Her preoperative hemoglobin was 12.4 and postop hemoglobin was 9.7. Patient had excellent urinary output and her Foley catheter was discontinued 0500 hrs. this morning and she has been voiding without any difficulty. She also had her PCA pump discontinue 0500 hrs. is taking Percocet on a when necessary basis. This morning she was passing flatus. She was up and ambulating has taken a shower and tolerating regular dilated discharge home today.  Consults: None  Significant Diagnostic Studies: labs: Preoperative hemoglobin 12.4 postoperatively hemoglobin 9.7  Pathology report: Diagnosis Uterus, cervix and bilateral fallopian tubes - CERVIX: TUNNEL CLUSTERS. - ENDOMETRIUM: BENIGN PROLIFERATIVE TYPE ENDOMETRIUM. ENDOMETRIOID TYPE POLYP. - MYOMETRIUM: LEIOMYOMATA. - SEROSA: UNREMARKABLE. - BILATERAL FALLOPIAN TUBES: PARATUBAL CYST(S).  Treatments: surgery: Total abdominal hysterectomy with bilateral salpingectomy  Discharge Exam: Blood pressure (!) 108/57, pulse 75, temperature 98.8 F (37.1 C), temperature source Oral, resp. rate 17, height 5\' 7"  (1.702 m), weight 200 lb (90.7 kg), last menstrual period 08/02/2016, SpO2 100 %. General appearance: alert and cooperative Resp: clear to auscultation bilaterally Cardio: regular rate and rhythm, S1, S2 normal, no murmur, click, rub or gallop GI: soft, non-tender; bowel sounds normal;  no masses,  no organomegaly Extremities: extremities normal, atraumatic, no cyanosis or edema Incision/Wound: Intact  Disposition: Final discharge disposition not confirmed  Discharge Instructions    Call MD for:  difficulty breathing, headache or visual disturbances    Complete by:  As directed    Call MD for:  hives    Complete by:  As directed    Call MD for:  persistant dizziness or light-headedness    Complete by:  As directed    Call MD for:  persistant nausea and vomiting    Complete by:  As directed    Call MD for:  redness, tenderness, or signs of infection (pain, swelling, redness, odor or green/yellow discharge around incision site)    Complete by:  As directed    Call MD for:  severe uncontrolled pain    Complete by:  As directed    Call MD for:  temperature >100.4    Complete by:  As directed    Diet general    Complete by:  As directed    Discharge instructions    Complete by:  As directed    Post op appointment in 2 weeks. Remove Honeycomb dressing this Saturday. Begin one iron tablet everyother day one week from today,   Driving Restrictions    Complete by:  As directed    1 week   Increase activity slowly    Complete by:  As directed    Lifting restrictions    Complete by:  As directed    6 weeks   Sexual Activity Restrictions    Complete by:  As directed    6 weeks       Medication List    TAKE these medications   amitriptyline 10 MG tablet Commonly known as:  ELAVIL Take 1 tablet (10 mg total) by mouth at bedtime.   cetirizine 10 MG tablet  Commonly known as:  ZYRTEC Take 1 tablet (10 mg total) by mouth daily.   oxyCODONE-acetaminophen 5-325 MG tablet Commonly known as:  PERCOCET Take 1 tablet by mouth every 4 (four) hours as needed for severe pain.   SUMAtriptan 100 MG tablet Commonly known as:  IMITREX Take 25 mg by mouth every 2 (two) hours as needed for migraine.        SignedTerrance Mass 08/12/2016, 6:20 PM

## 2016-08-26 ENCOUNTER — Ambulatory Visit: Payer: Commercial Managed Care - PPO | Admitting: Gynecology

## 2016-08-27 ENCOUNTER — Encounter: Payer: Self-pay | Admitting: Gynecology

## 2016-08-27 ENCOUNTER — Ambulatory Visit (INDEPENDENT_AMBULATORY_CARE_PROVIDER_SITE_OTHER): Payer: Commercial Managed Care - PPO | Admitting: Gynecology

## 2016-08-27 VITALS — BP 126/78

## 2016-08-27 DIAGNOSIS — Z23 Encounter for immunization: Secondary | ICD-10-CM | POA: Diagnosis not present

## 2016-08-27 DIAGNOSIS — Z09 Encounter for follow-up examination after completed treatment for conditions other than malignant neoplasm: Secondary | ICD-10-CM

## 2016-08-27 NOTE — Progress Notes (Signed)
Patient ID: Crystal Harris, female   DOB: 04-Nov-1970, 45 y.o.   MRN: [id number]      Patient is a 45 year old that presented to the office today for her two-week postop visit. Patient status post total abdominal hysterectomy bilateral salpingectomy as a result of symptomatic leiomyomatous uteri contributing to her dysfunctional uterine bleeding and anemia. Patient has done well from her surgery she is no longer taking narcotics taking supplemental vitamin with iron. Some pressure at time of urinating but no dysuria, frequency, fever, chills, nausea, vomiting. Minimal pinkish discharge as to be anticipated otherwise doing well.  Her pathology report demonstrated the following: Diagnosis Uterus, cervix and bilateral fallopian tubes - CERVIX: TUNNEL CLUSTERS. - ENDOMETRIUM: BENIGN PROLIFERATIVE TYPE ENDOMETRIUM. ENDOMETRIOID TYPE POLYP. - MYOMETRIUM: LEIOMYOMATA. - SEROSA: UNREMARKABLE. - BILATERAL FALLOPIAN TUBES: PARATUBAL CYST(S).  Her preoperative hemoglobin was 12.4 postop hemoglobin was 9.7  Abdomen: Soft nontender no rebound or guarding. Incision intact Pelvic: Bartholin urethra Skene was within normal limits Vagina: No lesions or discharge vaginal cuff intact Bimanual exam no palpable masses or tenderness Rectal exam not done  Assessment/plan: Patient 2 weeks status post total abdominal hysterectomy with bilateral salpingectomy as a result of symptomatic myomatous uteri is doing well. Patient to return to the office in 4 weeks for final postop visit for check her CBC at that point. Patient requesting to receive the flu vaccine which was administered after consent form was signed.

## 2016-08-27 NOTE — Patient Instructions (Signed)

## 2016-09-09 ENCOUNTER — Other Ambulatory Visit: Payer: Self-pay | Admitting: Gynecology

## 2016-09-09 DIAGNOSIS — Z1231 Encounter for screening mammogram for malignant neoplasm of breast: Secondary | ICD-10-CM

## 2016-09-21 ENCOUNTER — Ambulatory Visit
Admission: RE | Admit: 2016-09-21 | Discharge: 2016-09-21 | Disposition: A | Discharge status: Home / Self Care | Payer: Commercial Managed Care - PPO | Source: Ambulatory Visit | Attending: Gynecology | Admitting: Gynecology

## 2016-09-21 ENCOUNTER — Encounter: Payer: Self-pay | Admitting: Gynecology

## 2016-09-21 ENCOUNTER — Ambulatory Visit (INDEPENDENT_AMBULATORY_CARE_PROVIDER_SITE_OTHER): Payer: Commercial Managed Care - PPO | Admitting: Gynecology

## 2016-09-21 VITALS — BP 128/86

## 2016-09-21 DIAGNOSIS — D62 Acute posthemorrhagic anemia: Secondary | ICD-10-CM

## 2016-09-21 DIAGNOSIS — Z09 Encounter for follow-up examination after completed treatment for conditions other than malignant neoplasm: Secondary | ICD-10-CM

## 2016-09-21 DIAGNOSIS — Z1231 Encounter for screening mammogram for malignant neoplasm of breast: Secondary | ICD-10-CM

## 2016-09-21 LAB — CBC WITH DIFFERENTIAL/PLATELET
BASOS PCT: 1 %
Basophils Absolute: 63 cells/uL (ref 0–200)
Eosinophils Absolute: 252 cells/uL (ref 15–500)
Eosinophils Relative: 4 %
HEMATOCRIT: 40.2 % (ref 35.0–45.0)
HEMOGLOBIN: 12.4 g/dL (ref 11.7–15.5)
LYMPHS ABS: 2520 {cells}/uL (ref 850–3900)
Lymphocytes Relative: 40 %
MCH: 24.2 pg — ABNORMAL LOW (ref 27.0–33.0)
MCHC: 30.8 g/dL — ABNORMAL LOW (ref 32.0–36.0)
MCV: 78.5 fL — ABNORMAL LOW (ref 80.0–100.0)
MONO ABS: 441 {cells}/uL (ref 200–950)
MPV: 9.5 fL (ref 7.5–12.5)
Monocytes Relative: 7 %
Neutro Abs: 3024 cells/uL (ref 1500–7800)
Neutrophils Relative %: 48 %
Platelets: 441 10*3/uL — ABNORMAL HIGH (ref 140–400)
RBC: 5.12 MIL/uL — AB (ref 3.80–5.10)
RDW: 17.2 % — ABNORMAL HIGH (ref 11.0–15.0)
WBC: 6.3 10*3/uL (ref 3.8–10.8)

## 2016-09-21 NOTE — Progress Notes (Signed)
   Patient is a 45 year old that presented to the office for her six-week postop visit. She is asymptomatic and doing well with no complaints today.Patient status post total abdominal hysterectomy bilateral salpingectomy as a result of symptomatic leiomyomatous uteri contributing to her dysfunctional uterine bleeding and anemia. Patient has done well from her surgery she is no longer taking narcotics taking supplemental vitamin with iron.   Her pathology report demonstrated the following: Diagnosis Uterus, cervix and bilateral fallopian tubes - CERVIX: TUNNEL CLUSTERS. - ENDOMETRIUM: BENIGN PROLIFERATIVE TYPE ENDOMETRIUM. ENDOMETRIOID TYPE POLYP. - MYOMETRIUM: LEIOMYOMATA. - SEROSA: UNREMARKABLE. - BILATERAL FALLOPIAN TUBES: PARATUBAL CYST(S).  Her preoperative hemoglobin was 12.4 postop hemoglobin was 9.7  Abdomen: Soft nontender no rebound or guarding. Incision intact Pelvic: Bartholin urethra Skene was within normal limits Vagina: No lesions or discharge vaginal cuff intact Bimanual exam no palpable masses or tenderness Rectal exam not done  Assessment/plan: Patient 6 weeks status post total abdominal hysterectomy with bilateral salpingectomy doing well for symptomatic leiomyomatous uteri. Patient was given medical release to return to full duty tomorrow at work. Because of her mild anemia a CBC will be checked today. Patient otherwise scheduled to return back next year for annual exam or when necessary.

## 2016-09-22 ENCOUNTER — Other Ambulatory Visit: Payer: Self-pay | Admitting: Gynecology

## 2016-09-22 DIAGNOSIS — R7989 Other specified abnormal findings of blood chemistry: Secondary | ICD-10-CM

## 2016-12-11 ENCOUNTER — Other Ambulatory Visit: Payer: Self-pay | Admitting: Family Medicine

## 2016-12-22 ENCOUNTER — Other Ambulatory Visit: Payer: Commercial Managed Care - PPO

## 2016-12-22 DIAGNOSIS — R7989 Other specified abnormal findings of blood chemistry: Secondary | ICD-10-CM

## 2016-12-22 DIAGNOSIS — D473 Essential (hemorrhagic) thrombocythemia: Secondary | ICD-10-CM | POA: Diagnosis not present

## 2016-12-22 LAB — CBC WITH DIFFERENTIAL/PLATELET
BASOS PCT: 0 %
Basophils Absolute: 0 cells/uL (ref 0–200)
Eosinophils Absolute: 252 cells/uL (ref 15–500)
Eosinophils Relative: 3 %
HCT: 42.5 % (ref 35.0–45.0)
Hemoglobin: 13.7 g/dL (ref 11.7–15.5)
Lymphocytes Relative: 43 %
Lymphs Abs: 3612 cells/uL (ref 850–3900)
MCH: 25.5 pg — ABNORMAL LOW (ref 27.0–33.0)
MCHC: 32.2 g/dL (ref 32.0–36.0)
MCV: 79 fL — ABNORMAL LOW (ref 80.0–100.0)
MONO ABS: 672 {cells}/uL (ref 200–950)
MONOS PCT: 8 %
MPV: 9.7 fL (ref 7.5–12.5)
Neutro Abs: 3864 cells/uL (ref 1500–7800)
Neutrophils Relative %: 46 %
PLATELETS: 381 10*3/uL (ref 140–400)
RBC: 5.38 MIL/uL — AB (ref 3.80–5.10)
RDW: 16.2 % — AB (ref 11.0–15.0)
WBC: 8.4 10*3/uL (ref 3.8–10.8)

## 2017-01-01 ENCOUNTER — Other Ambulatory Visit: Payer: Self-pay | Admitting: Family Medicine

## 2017-01-01 ENCOUNTER — Telehealth: Payer: Self-pay | Admitting: Physician Assistant

## 2017-01-01 NOTE — Telephone Encounter (Signed)
Pt called returning a phone call from a rn about meds Please advise: [phone]

## 2017-01-01 NOTE — Telephone Encounter (Signed)
At last visit, July 2017, patient was advised to increase the amitriptyline dose. LM to call back to clarify dose. ALSO, who is PCP? This medication, long term, should be prescribed by her PCP, and there is not one listed in her record.

## 2017-01-04 NOTE — Telephone Encounter (Signed)
Left message to return message 

## 2017-01-14 ENCOUNTER — Ambulatory Visit (INDEPENDENT_AMBULATORY_CARE_PROVIDER_SITE_OTHER): Payer: Commercial Managed Care - PPO | Admitting: Emergency Medicine

## 2017-01-14 VITALS — BP 118/76 | HR 76 | Temp 99.1°F | Resp 18 | Ht 67.0 in | Wt 207.0 lb

## 2017-01-14 DIAGNOSIS — IMO0002 Reserved for concepts with insufficient information to code with codable children: Secondary | ICD-10-CM

## 2017-01-14 DIAGNOSIS — R5383 Other fatigue: Secondary | ICD-10-CM

## 2017-01-14 DIAGNOSIS — G43709 Chronic migraine without aura, not intractable, without status migrainosus: Secondary | ICD-10-CM

## 2017-01-14 MED ORDER — SUMATRIPTAN SUCCINATE 100 MG PO TABS: 25.0000 mg | ORAL_TABLET | ORAL | 3 refills | Status: DC | PRN

## 2017-01-14 MED ORDER — AMITRIPTYLINE HCL 10 MG PO TABS: 10.0000 mg | ORAL_TABLET | Freq: Every day | ORAL | 3 refills | Status: DC

## 2017-01-14 NOTE — Patient Instructions (Signed)
     IF you received an x-ray today, you will receive an invoice from [facility]. Please contact [facility] at [phone] with questions or concerns regarding your invoice.   IF you received labwork today, you will receive an invoice from [facility]. Please contact [facility] at [phone] with questions or concerns regarding your invoice.   Our billing staff will not be able to assist you with questions regarding bills from these companies.  You will be contacted with the lab results as soon as they are available. The fastest way to get your results is to activate your My Chart account. Instructions are located on the last page of this paperwork. If you have not heard from us regarding the results in 2 weeks, please contact this office.     

## 2017-01-14 NOTE — Progress Notes (Signed)
Crystal Harris 46 y.o.   Chief Complaint  Patient presents with  . Medication Refill    Elavil and Imitrex   . Hypothyroidism    would like to dicuss it     HISTORY OF PRESENT ILLNESS: This is a 46 y.o. female with h/o chronic migraines, needs med refills; also c/o several mos h/o multiple symptoms including weight gain, fatigue, feeling exhausted, "brain fog", constipation, dizziness; has high stress job.  HPI   Prior to Admission medications   Medication Sig Start Date End Date Taking? Authorizing Provider  amitriptyline (ELAVIL) 10 MG tablet Take 1 tablet (10 mg total) by mouth at bedtime. 01/14/17  Yes Adaora Mchaney Joellen Jersey, MD  cetirizine (ZYRTEC) 10 MG tablet Take 1 tablet (10 mg total) by mouth daily. 04/18/16  Yes Shawnee Knapp, MD  SUMAtriptan (IMITREX) 100 MG tablet Take 0.5 tablets (50 mg total) by mouth every 2 (two) hours as needed for migraine. 01/14/17  Yes Yassen Kinnett Joellen Jersey, MD  oxyCODONE-acetaminophen (PERCOCET) 5-325 MG tablet Take 1 tablet by mouth every 4 (four) hours as needed for severe pain. Patient not taking: Reported on 09/21/2016 06/10/16   Terrance Mass, MD    No Known Allergies  There are no active problems to display for this patient.   Past Medical History:  Diagnosis Date  . Allergy   . Anxiety    Claustophobic - no meds0  . Arthritis    lower back, rupture disk L5, bulging disk L4, no meds  . Bronchitis   . Chest congestion   . GERD (gastroesophageal reflux disease)    occasional, no meds, diet controlled  . Headache    Migraines  . Heart murmur    mild during pregnancy, never caused any problems  . Pneumonia   . Shortness of breath dyspnea   . Sinus congestion     Past Surgical History:  Procedure Laterality Date  . ABDOMINAL HYSTERECTOMY N/A 08/11/2016   Procedure: HYSTERECTOMY ABDOMINAL;  Surgeon: Terrance Mass, MD;  Location: [facility];  Service: Gynecology;  Laterality: N/A;  . BARTHOLIN CYST MARSUPIALIZATION    . BILATERAL  SALPINGECTOMY Bilateral 08/11/2016   Procedure: BILATERAL SALPINGECTOMY;  Surgeon: Terrance Mass, MD;  Location: [facility];  Service: Gynecology;  Laterality: Bilateral;  . CESAREAN SECTION     x 1  . WISDOM TOOTH EXTRACTION      Social History   Social History  . Marital status: Divorced    Spouse name: N/A  . Number of children: N/A  . Years of education: N/A   Occupational History  . Not on file.   Social History Main Topics  . Smoking status: Never Smoker  . Smokeless tobacco: Never Used  . Alcohol use 0.0 oz/week     Comment: OCC  . Drug use: No  . Sexual activity: Yes    Birth control/ protection: None   Other Topics Concern  . Not on file   Social History Narrative  . No narrative on file    Family History  Problem Relation Age of Onset  . Diabetes Mother   . Hyperlipidemia Mother   . Cancer Father   . Hypertension Father   . Cancer Sister   . Hypertension Sister   . Hyperlipidemia Brother      Review of Systems  Constitutional: Positive for malaise/fatigue. Negative for chills and fever.       Weight gain  HENT: Negative for congestion, ear discharge, ear pain, hearing loss, nosebleeds, sinus  pain and sore throat.   Eyes: Negative for blurred vision, double vision, discharge and redness.  Respiratory: Negative for cough, hemoptysis, shortness of breath and wheezing.   Cardiovascular: Negative for chest pain, palpitations, claudication and leg swelling.  Gastrointestinal: Positive for constipation. Negative for abdominal pain, blood in stool, diarrhea, heartburn, melena, nausea and vomiting.  Genitourinary: Negative for dysuria, flank pain and hematuria.  Musculoskeletal: Positive for joint pain (mild). Negative for back pain, myalgias and neck pain.  Skin: Negative for rash.  Neurological: Positive for dizziness, weakness and headaches. Negative for sensory change, focal weakness, seizures and loss of consciousness.  Endo/Heme/Allergies: Negative.   Negative for polydipsia. Does not bruise/bleed easily.  Psychiatric/Behavioral: Negative for depression, substance abuse and suicidal ideas. The patient has insomnia (interrupted sleep).   All other systems reviewed and are negative.    Physical Exam  Constitutional: She is oriented to person, place, and time. She appears well-developed and well-nourished.  HENT:  Head: Normocephalic and atraumatic.  Right Ear: Tympanic membrane and external ear normal.  Left Ear: Tympanic membrane and external ear normal.  Nose: Nose normal.  Mouth/Throat: Oropharynx is clear and moist. No oropharyngeal exudate.  Eyes: Conjunctivae and EOM are normal. Pupils are equal, round, and reactive to light.  Neck: Normal range of motion. Neck supple. No JVD present. No thyromegaly present.  Cardiovascular: Normal rate, regular rhythm, normal heart sounds and intact distal pulses.  Exam reveals no gallop.   No murmur heard. Pulmonary/Chest: Effort normal and breath sounds normal. She has no wheezes. She has no rales.  Abdominal: Soft. Bowel sounds are normal. She exhibits no distension. There is no tenderness.  Musculoskeletal: Normal range of motion. She exhibits no edema or tenderness.  Lymphadenopathy:    She has no cervical adenopathy.  Neurological: She is alert and oriented to person, place, and time. She displays normal reflexes. No sensory deficit. She exhibits normal muscle tone. Coordination normal.  Skin: Skin is warm and dry. Capillary refill takes less than 2 seconds. No rash noted.  Psychiatric: She has a normal mood and affect. Her behavior is normal.  Vitals reviewed.    ASSESSMENT & PLAN: [name] was seen today for medication refill and hypothyroidism.  Diagnoses and all orders for this visit:  Fatigue, unspecified type -     CBC with Differential/Platelet -     Comprehensive metabolic panel -     Hemoglobin A1c -     HIV antibody -     Lipid panel -     TSH -     Cancel: ANA, IFA  Comprehensive Panel -     T3, Free -     T4, Free -     Cortisol, Baseline -     ANA, IFA (with reflex)  Chronic migraine -     ANA, IFA (with reflex)  Other orders -     amitriptyline (ELAVIL) 10 MG tablet; Take 1 tablet (10 mg total) by mouth at bedtime. -     SUMAtriptan (IMITREX) 100 MG tablet; Take 0.5 tablets (50 mg total) by mouth every 2 (two) hours as needed for migraine.      Agustina Caroli, MD [facility]

## 2017-01-15 LAB — CBC WITH DIFFERENTIAL/PLATELET
Basophils Absolute: 0 10*3/uL (ref 0.0–0.2)
Basos: 0 %
EOS (ABSOLUTE): 0.3 10*3/uL (ref 0.0–0.4)
EOS: 4 %
HEMOGLOBIN: 13.5 g/dL (ref 11.1–15.9)
Hematocrit: 40.4 % (ref 34.0–46.6)
IMMATURE GRANULOCYTES: 0 %
Immature Grans (Abs): 0 10*3/uL (ref 0.0–0.1)
Lymphocytes Absolute: 2.8 10*3/uL (ref 0.7–3.1)
Lymphs: 39 %
MCH: 25.7 pg — ABNORMAL LOW (ref 26.6–33.0)
MCHC: 33.4 g/dL (ref 31.5–35.7)
MCV: 77 fL — ABNORMAL LOW (ref 79–97)
MONOCYTES: 5 %
Monocytes Absolute: 0.4 10*3/uL (ref 0.1–0.9)
NEUTROS PCT: 52 %
Neutrophils Absolute: 3.7 10*3/uL (ref 1.4–7.0)
Platelets: 361 10*3/uL (ref 150–379)
RBC: 5.26 x10E6/uL (ref 3.77–5.28)
RDW: 16.7 % — ABNORMAL HIGH (ref 12.3–15.4)
WBC: 7.2 10*3/uL (ref 3.4–10.8)

## 2017-01-15 LAB — LIPID PANEL
CHOL/HDL RATIO: 5.1 ratio — AB (ref 0.0–4.4)
Cholesterol, Total: 213 mg/dL — ABNORMAL HIGH (ref 100–199)
HDL: 42 mg/dL (ref >39)
LDL CALC: 116 mg/dL — AB (ref 0–99)
Triglycerides: 274 mg/dL — ABNORMAL HIGH (ref 0–149)
VLDL Cholesterol Cal: 55 mg/dL — ABNORMAL HIGH (ref 5–40)

## 2017-01-15 LAB — COMPREHENSIVE METABOLIC PANEL
ALBUMIN: 4.1 g/dL (ref 3.5–5.5)
ALT: 14 IU/L (ref 0–32)
AST: 17 IU/L (ref 0–40)
Albumin/Globulin Ratio: 1.6 (ref 1.2–2.2)
Alkaline Phosphatase: 93 IU/L (ref 39–117)
BUN / CREAT RATIO: 18 (ref 9–23)
BUN: 13 mg/dL (ref 6–24)
Bilirubin Total: 0.2 mg/dL (ref 0.0–1.2)
CALCIUM: 9.5 mg/dL (ref 8.7–10.2)
CO2: 21 mmol/L (ref 18–29)
Chloride: 98 mmol/L (ref 96–106)
Creatinine, Ser: 0.74 mg/dL (ref 0.57–1.00)
GFR calc Af Amer: 112 mL/min/{1.73_m2} (ref >59)
GFR, EST NON AFRICAN AMERICAN: 97 mL/min/{1.73_m2} (ref >59)
GLOBULIN, TOTAL: 2.6 g/dL (ref 1.5–4.5)
Glucose: 116 mg/dL — ABNORMAL HIGH (ref 65–99)
Potassium: 3.8 mmol/L (ref 3.5–5.2)
SODIUM: 139 mmol/L (ref 134–144)
Total Protein: 6.7 g/dL (ref 6.0–8.5)

## 2017-01-15 LAB — T3, FREE: T3, Free: 2.8 pg/mL (ref 2.0–4.4)

## 2017-01-15 LAB — HEMOGLOBIN A1C
Est. average glucose Bld gHb Est-mCnc: 128 mg/dL
Hgb A1c MFr Bld: 6.1 % — ABNORMAL HIGH (ref 4.8–5.6)

## 2017-01-15 LAB — ANTINUCLEAR ANTIBODIES, IFA: ANA Titer 1: NEGATIVE

## 2017-01-15 LAB — CORTISOL, BASELINE: Cortisol, Baseline: 5.5 ug/dL

## 2017-01-15 LAB — TSH: TSH: 1.73 u[IU]/mL (ref 0.450–4.500)

## 2017-01-15 LAB — T4, FREE: Free T4: 1.08 ng/dL (ref 0.82–1.77)

## 2017-01-15 LAB — HIV ANTIBODY (ROUTINE TESTING W REFLEX): HIV Screen 4th Generation wRfx: NONREACTIVE

## 2017-03-03 ENCOUNTER — Encounter: Payer: Self-pay | Admitting: Gynecology

## 2017-03-24 DIAGNOSIS — J019 Acute sinusitis, unspecified: Secondary | ICD-10-CM | POA: Diagnosis not present

## 2017-04-19 ENCOUNTER — Other Ambulatory Visit: Payer: Self-pay | Admitting: Family Medicine

## 2017-04-19 DIAGNOSIS — Z889 Allergy status to unspecified drugs, medicaments and biological substances status: Secondary | ICD-10-CM

## 2017-04-19 DIAGNOSIS — R0981 Nasal congestion: Secondary | ICD-10-CM

## 2017-05-14 ENCOUNTER — Ambulatory Visit (INDEPENDENT_AMBULATORY_CARE_PROVIDER_SITE_OTHER): Payer: Commercial Managed Care - PPO | Admitting: Gynecology

## 2017-05-14 ENCOUNTER — Encounter: Payer: Self-pay | Admitting: Gynecology

## 2017-05-14 VITALS — BP 118/78 | Ht 67.0 in | Wt 204.0 lb

## 2017-05-14 DIAGNOSIS — Z01419 Encounter for gynecological examination (general) (routine) without abnormal findings: Secondary | ICD-10-CM

## 2017-05-14 DIAGNOSIS — R739 Hyperglycemia, unspecified: Secondary | ICD-10-CM | POA: Diagnosis not present

## 2017-05-14 DIAGNOSIS — E782 Mixed hyperlipidemia: Secondary | ICD-10-CM | POA: Insufficient documentation

## 2017-05-14 NOTE — Progress Notes (Signed)
Crystal Harris 04-Dec-1970 [id number]   History:    46 y.o.  for annual gyn exam with no complaints today. Patient had stated she had nontender urgent care. March of this year feeling tired and 15 had blood work. I reviewed her blood work and she was nonfasting her metabolic panel indicated her blood sugar was elevated 119 and they did a lipid profile and her total cholesterol was elevated at 213 and her LDL elevated at 116 and triglycerides elevated at 274 and her cholesterol/HDL was elevated at 5.1. She is not fasting today and a would like to repeat a fasting blood sugar and lipid profile in the next couple weeks here in the office.  Review of patient's record indicated in December 2017 patient had a total abdominal hysterectomy with bilateral salpingectomy as a result of symptomatic leiomyomatous uteri which had been contributing to her dysfunctional uterine bleeding and anemia. She has done well since her surgery. Patient prior to her surgery never had any history of abnormal Pap smears.  Past medical history,surgical history, family history and social history were all reviewed and documented in the EPIC chart.  Gynecologic History Patient's last menstrual period was 08/02/2016. Contraception: status post hysterectomy Last Pap: 2017. Results were: normal Last mammogram: 2017. Results were: normal  Obstetric History OB History  Gravida Para Term Preterm AB Living  3 2     1 2   SAB TAB Ectopic Multiple Live Births               # Outcome Date GA Lbr Len/2nd Weight Sex Delivery Anes PTL Lv  3 AB           2 Para           1 Para                ROS: A ROS was performed and pertinent positives and negatives are included in the history.  GENERAL: No fevers or chills. HEENT: No change in vision, no earache, sore throat or sinus congestion. NECK: No pain or stiffness. CARDIOVASCULAR: No chest pain or pressure. No palpitations. PULMONARY: No shortness of breath, cough or wheeze.  GASTROINTESTINAL: No abdominal pain, nausea, vomiting or diarrhea, melena or bright red blood per rectum. GENITOURINARY: No urinary frequency, urgency, hesitancy or dysuria. MUSCULOSKELETAL: No joint or muscle pain, no back pain, no recent trauma. DERMATOLOGIC: No rash, no itching, no lesions. ENDOCRINE: No polyuria, polydipsia, no heat or cold intolerance. No recent change in weight. HEMATOLOGICAL: No anemia or easy bruising or bleeding. NEUROLOGIC: No headache, seizures, numbness, tingling or weakness. PSYCHIATRIC: No depression, no loss of interest in normal activity or change in sleep pattern.     Exam: chaperone present  BP 118/78   Ht 5\' 7"  (1.702 m)   Wt 204 lb (92.5 kg)   LMP 08/02/2016   BMI 31.95 kg/m   Body mass index is 31.95 kg/m.  General appearance : Well developed well nourished female. No acute distress HEENT: Eyes: no retinal hemorrhage or exudates,  Neck supple, trachea midline, no carotid bruits, no thyroidmegaly Lungs: Clear to auscultation, no rhonchi or wheezes, or rib retractions  Heart: Regular rate and rhythm, no murmurs or gallops Breast:Examined in sitting and supine position were symmetrical in appearance, no palpable masses or tenderness,  no skin retraction, no nipple inversion, no nipple discharge, no skin discoloration, no axillary or supraclavicular lymphadenopathy Abdomen: no palpable masses or tenderness, no rebound or guarding Extremities: no edema or skin discoloration or  tenderness  Pelvic:  Bartholin, Urethra, Skene Glands: Within normal limits             Vagina: No gross lesions or discharge  Cervix: Absent  Uterus  absent  Adnexa  Without masses or tenderness  Anus and perineum  normal   Rectovaginal  normal sphincter tone without palpated masses or tenderness             Hemoccult not indicated     Assessment/Plan:  46 y.o. female for annual exam doing well since her TAH bilateral salpingectomy in 2017 as a result of symptomatic  leiomyomatous uteri. Patient will return to the office in next few weeks for a fasting blood sugar and fasting lipid profile due to the fact that time that she had this blood work done at the urgent care in March of this year she was nonfasting and was abnormal. She also has has to schedule her mammogram December this year. Patient no longer needs mammograms. We discussed importance of monthly breast exams.   Terrance Mass MD, 4:32 PM 05/14/2017

## 2017-05-23 DIAGNOSIS — L237 Allergic contact dermatitis due to plants, except food: Secondary | ICD-10-CM | POA: Diagnosis not present

## 2017-06-04 ENCOUNTER — Other Ambulatory Visit: Payer: Commercial Managed Care - PPO

## 2017-06-11 ENCOUNTER — Other Ambulatory Visit: Payer: Self-pay | Admitting: Obstetrics & Gynecology

## 2017-06-11 DIAGNOSIS — Z1231 Encounter for screening mammogram for malignant neoplasm of breast: Secondary | ICD-10-CM

## 2017-06-20 ENCOUNTER — Other Ambulatory Visit: Payer: Self-pay | Admitting: Family Medicine

## 2017-08-10 ENCOUNTER — Ambulatory Visit (INDEPENDENT_AMBULATORY_CARE_PROVIDER_SITE_OTHER): Payer: Commercial Managed Care - PPO | Admitting: Emergency Medicine

## 2017-08-10 ENCOUNTER — Encounter: Payer: Self-pay | Admitting: Emergency Medicine

## 2017-08-10 VITALS — BP 110/70 | HR 79 | Temp 99.4°F | Resp 16 | Ht 66.5 in | Wt 202.6 lb

## 2017-08-10 DIAGNOSIS — R0981 Nasal congestion: Secondary | ICD-10-CM

## 2017-08-10 DIAGNOSIS — R0989 Other specified symptoms and signs involving the circulatory and respiratory systems: Secondary | ICD-10-CM | POA: Diagnosis not present

## 2017-08-10 DIAGNOSIS — R05 Cough: Secondary | ICD-10-CM

## 2017-08-10 DIAGNOSIS — R059 Cough, unspecified: Secondary | ICD-10-CM

## 2017-08-10 MED ORDER — PROMETHAZINE-CODEINE 6.25-10 MG/5ML PO SYRP: 5.0000 mL | ORAL_SOLUTION | Freq: Every evening | ORAL | 0 refills | Status: DC | PRN

## 2017-08-10 MED ORDER — AMOXICILLIN-POT CLAVULANATE 875-125 MG PO TABS: 1.0000 | ORAL_TABLET | Freq: Two times a day (BID) | ORAL | 0 refills | Status: AC

## 2017-08-10 NOTE — Progress Notes (Signed)
Crystal Harris 46 y.o.   Chief Complaint  Patient presents with  . Cough    x 2 week  - noproductive  . Nasal Congestion    HISTORY OF PRESENT ILLNESS: This is a 46 y.o. female complaining of URI symptoms x 2 weeks.  URI   This is a new problem. The current episode started 1 to 4 weeks ago. The problem has been gradually worsening. There has been no fever. Associated symptoms include chest pain, congestion, coughing, sinus pain and a sore throat. Pertinent negatives include no abdominal pain, diarrhea, dysuria, ear pain, headaches, joint pain, nausea, neck pain, rash, rhinorrhea, vomiting or wheezing. She has tried antihistamine, decongestant, increased fluids, inhaler use and NSAIDs (Medrol dose pak, Tessalon, Albuterol) for the symptoms.     Prior to Admission medications   Medication Sig Start Date End Date Taking? Authorizing Provider  Albuterol Sulfate (PROAIR HFA IN) Inhale into the lungs as needed.   Yes [provider]  amitriptyline (ELAVIL) 10 MG tablet Take 1 tablet (10 mg total) by mouth at bedtime. Office visit needed for refills 06/22/17  Yes Shawnee Knapp, MD  benzonatate (TESSALON) 100 MG capsule Take by mouth 3 (three) times daily.   Yes [provider]  methylPREDNISolone (MEDROL DOSEPAK) 4 MG TBPK tablet Take by mouth.   Yes [provider]  SUMAtriptan (IMITREX) 100 MG tablet Take 0.5 tablets (50 mg total) by mouth every 2 (two) hours as needed for migraine. 01/14/17  Yes Horald Pollen, MD  cetirizine (ZYRTEC) 10 MG tablet TAKE 1 TABLET BY MOUTH EVERY DAY Patient not taking: Reported on 08/10/2017 04/19/17   Shawnee Knapp, MD    No Known Allergies  Patient Active Problem List   Diagnosis Date Noted  . Mixed hyperlipidemia 05/14/2017  . Hyperglycemia 05/14/2017    Past Medical History:  Diagnosis Date  . Allergy   . Anxiety    Claustophobic - no meds0  . Arthritis    lower back, rupture disk L5, bulging disk L4, no meds  .  Bronchitis   . Chest congestion   . GERD (gastroesophageal reflux disease)    occasional, no meds, diet controlled  . Headache    Migraines  . Heart murmur    mild during pregnancy, never caused any problems  . Pneumonia   . Shortness of breath dyspnea   . Sinus congestion     Past Surgical History:  Procedure Laterality Date  . ABDOMINAL HYSTERECTOMY N/A 08/11/2016   Procedure: HYSTERECTOMY ABDOMINAL;  Surgeon: Terrance Mass, MD;  Location: [facility];  Service: Gynecology;  Laterality: N/A;  . BARTHOLIN CYST MARSUPIALIZATION    . BILATERAL SALPINGECTOMY Bilateral 08/11/2016   Procedure: BILATERAL SALPINGECTOMY;  Surgeon: Terrance Mass, MD;  Location: [facility];  Service: Gynecology;  Laterality: Bilateral;  . CESAREAN SECTION     x 1  . WISDOM TOOTH EXTRACTION      Social History   Social History  . Marital status: Divorced    Spouse name: N/A  . Number of children: N/A  . Years of education: N/A   Occupational History  . Not on file.   Social History Main Topics  . Smoking status: Never Smoker  . Smokeless tobacco: Never Used  . Alcohol use 0.0 oz/week     Comment: OCC  . Drug use: No  . Sexual activity: Yes    Birth control/ protection: None   Other Topics Concern  . Not on file  Social History Narrative  . No narrative on file    Family History  Problem Relation Age of Onset  . Diabetes Mother   . Hyperlipidemia Mother   . Cancer Father   . Hypertension Father   . Cancer Sister   . Hypertension Sister   . Hyperlipidemia Brother      Review of Systems  Constitutional: Positive for malaise/fatigue. Negative for chills and fever.  HENT: Positive for congestion, sinus pain and sore throat. Negative for ear pain and rhinorrhea.   Eyes: Negative.  Negative for discharge and redness.  Respiratory: Positive for cough. Negative for wheezing.   Cardiovascular: Positive for chest pain. Negative for palpitations.  Gastrointestinal: Negative for abdominal  pain, diarrhea, nausea and vomiting.  Genitourinary: Negative for dysuria.  Musculoskeletal: Negative for joint pain and neck pain.  Skin: Negative for rash.  Neurological: Negative.  Negative for headaches.  Endo/Heme/Allergies: Negative.   All other systems reviewed and are negative.   Vitals:   08/10/17 1728  BP: 110/70  Pulse: 79  Resp: 16  Temp: 99.4 F (37.4 C)  SpO2: 96%    Physical Exam  Constitutional: She is oriented to person, place, and time. She appears well-developed and well-nourished.  HENT:  Head: Normocephalic and atraumatic.  Right Ear: External ear normal.  Left Ear: External ear normal.  Nose: Nose normal.  Mouth/Throat: Oropharynx is clear and moist.  Eyes: Pupils are equal, round, and reactive to light. Conjunctivae and EOM are normal.  Neck: Normal range of motion. Neck supple. No JVD present. No thyromegaly present.  Cardiovascular: Normal rate, regular rhythm, normal heart sounds and intact distal pulses.   Pulmonary/Chest: Effort normal and breath sounds normal.  Abdominal: Soft. Bowel sounds are normal. She exhibits no distension. There is no tenderness.  Musculoskeletal: Normal range of motion.  Lymphadenopathy:    She has no cervical adenopathy.  Neurological: She is alert and oriented to person, place, and time. No sensory deficit. She exhibits normal muscle tone.  Skin: Skin is warm and dry. Capillary refill takes less than 2 seconds. No rash noted.  Psychiatric: She has a normal mood and affect. Her behavior is normal.  Vitals reviewed.    ASSESSMENT & PLAN: Jeannett was seen today for cough and nasal congestion.  Diagnoses and all orders for this visit:  Cough  Chest congestion  Sinus congestion  Other orders -     amoxicillin-clavulanate (AUGMENTIN) 875-125 MG tablet; Take 1 tablet by mouth 2 (two) times daily. -     promethazine-codeine (PHENERGAN WITH CODEINE) 6.25-10 MG/5ML syrup; Take 5 mLs by mouth at bedtime as needed for  cough.    Patient Instructions       IF you received an x-ray today, you will receive an invoice from [facility]. Please contact [facility] at [phone] with questions or concerns regarding your invoice.   IF you received labwork today, you will receive an invoice from [facility]. Please contact [facility] at [phone] with questions or concerns regarding your invoice.   Our billing staff will not be able to assist you with questions regarding bills from these companies.  You will be contacted with the lab results as soon as they are available. The fastest way to get your results is to activate your My Chart account. Instructions are located on the last page of this paperwork. If you have not heard from [place] regarding the results in 2 weeks, please contact this office.     Upper Respiratory Infection, Adult Most  upper respiratory infections (URIs) are caused by a virus. A URI affects the nose, throat, and upper air passages. The most common type of URI is often called "the common cold." Follow these instructions at home:  Take medicines only as told by your doctor.  Gargle warm saltwater or take cough drops to comfort your throat as told by your doctor.  Use a warm mist humidifier or inhale steam from a shower to increase air moisture. This may make it easier to breathe.  Drink enough fluid to keep your pee (urine) clear or pale yellow.  Eat soups and other clear broths.  Have a healthy diet.  Rest as needed.  Go back to work when your fever is gone or your doctor says it is okay. ? You may need to stay home longer to avoid giving your URI to others. ? You can also wear a face mask and wash your hands often to prevent spread of the virus.  Use your inhaler more if you have asthma.  Do not use any tobacco products, including cigarettes, chewing tobacco, or electronic cigarettes. If you need help quitting, ask your doctor. Contact a doctor if:  You are  getting worse, not better.  Your symptoms are not helped by medicine.  You have chills.  You are getting more short of breath.  You have brown or red mucus.  You have yellow or brown discharge from your nose.  You have pain in your face, especially when you bend forward.  You have a fever.  You have puffy (swollen) neck glands.  You have pain while swallowing.  You have white areas in the back of your throat. Get help right away if:  You have very bad or constant: ? Headache. ? Ear pain. ? Pain in your forehead, behind your eyes, and over your cheekbones (sinus pain). ? Chest pain.  You have long-lasting (chronic) lung disease and any of the following: ? Wheezing. ? Long-lasting cough. ? Coughing up blood. ? A change in your usual mucus.  You have a stiff neck.  You have changes in your: ? Vision. ? Hearing. ? Thinking. ? Mood. This information is not intended to replace advice given to you by your health care provider. Make sure you discuss any questions you have with your health care provider. Document Released: 03/23/2008 Document Revised: 06/07/2016 Document Reviewed: 01/10/2014 Elsevier Interactive Patient Education  2018 Elsevier Inc.      Agustina Caroli, MD [facility]

## 2017-08-10 NOTE — Patient Instructions (Addendum)
     IF you received an x-ray today, you will receive an invoice from [facility]. Please contact  Radiology at [phone] with questions or concerns regarding your invoice.   IF you received labwork today, you will receive an invoice from [facility]. Please contact [facility] at [phone] with questions or concerns regarding your invoice.   Our billing staff will not be able to assist you with questions regarding bills from these companies.  You will be contacted with the lab results as soon as they are available. The fastest way to get your results is to activate your My Chart account. Instructions are located on the last page of this paperwork. If you have not heard from us regarding the results in 2 weeks, please contact this office.     Upper Respiratory Infection, Adult Most upper respiratory infections (URIs) are caused by a virus. A URI affects the nose, throat, and upper air passages. The most common type of URI is often called "the common cold." Follow these instructions at home:  Take medicines only as told by your doctor.  Gargle warm saltwater or take cough drops to comfort your throat as told by your doctor.  Use a warm mist humidifier or inhale steam from a shower to increase air moisture. This may make it easier to breathe.  Drink enough fluid to keep your pee (urine) clear or pale yellow.  Eat soups and other clear broths.  Have a healthy diet.  Rest as needed.  Go back to work when your fever is gone or your doctor says it is okay. ? You may need to stay home longer to avoid giving your URI to others. ? You can also wear a face mask and wash your hands often to prevent spread of the virus.  Use your inhaler more if you have asthma.  Do not use any tobacco products, including cigarettes, chewing tobacco, or electronic cigarettes. If you need help quitting, ask your doctor. Contact a doctor if:  You are getting worse, not better.  Your  symptoms are not helped by medicine.  You have chills.  You are getting more short of breath.  You have brown or red mucus.  You have yellow or brown discharge from your nose.  You have pain in your face, especially when you bend forward.  You have a fever.  You have puffy (swollen) neck glands.  You have pain while swallowing.  You have white areas in the back of your throat. Get help right away if:  You have very bad or constant: ? Headache. ? Ear pain. ? Pain in your forehead, behind your eyes, and over your cheekbones (sinus pain). ? Chest pain.  You have long-lasting (chronic) lung disease and any of the following: ? Wheezing. ? Long-lasting cough. ? Coughing up blood. ? A change in your usual mucus.  You have a stiff neck.  You have changes in your: ? Vision. ? Hearing. ? Thinking. ? Mood. This information is not intended to replace advice given to you by your health care provider. Make sure you discuss any questions you have with your health care provider. Document Released: 03/23/2008 Document Revised: 06/07/2016 Document Reviewed: 01/10/2014 Elsevier Interactive Patient Education  2018 Elsevier Inc.  

## 2017-08-13 ENCOUNTER — Encounter: Payer: Self-pay | Admitting: Family Medicine

## 2017-08-13 ENCOUNTER — Ambulatory Visit (INDEPENDENT_AMBULATORY_CARE_PROVIDER_SITE_OTHER): Payer: Commercial Managed Care - PPO | Admitting: Family Medicine

## 2017-08-13 VITALS — BP 114/76 | HR 88 | Temp 99.4°F | Resp 16 | Ht 67.0 in | Wt 200.4 lb

## 2017-08-13 DIAGNOSIS — J069 Acute upper respiratory infection, unspecified: Secondary | ICD-10-CM

## 2017-08-13 NOTE — Progress Notes (Signed)
10/26/20186:33 PM  Crystal Harris 1971-08-20, 46 y.o. female [id number]  Chief Complaint  Patient presents with  . Follow-up    COUGH - not better    HPI:   Patient is a 46 y.o. female who presents today for evaluation of cough that is not getting better.  2 weeks ago patient started with having fluids in her ears and nasal congestion, treated with flonase and sudafed, did not help. Then about a  week later she continued having continuously PND, felt cold had "fallen into her chest" and she started coughing constantly. She reports her colds normally behave this way. She was evaluated via [facility], treated with albuterol and medrol pack, was not getting better so seen 3 days ago, given augmenting and cough medication with codeine.  She reports clear sputum, denies any fevers, but does get chills. She denies any SOB.  Depression screen [facility] 2/9 08/13/2017 08/10/2017 01/14/2017  Decreased Interest 0 0 0  Down, Depressed, Hopeless 0 0 0  PHQ - 2 Score 0 0 0    No Known Allergies  Prior to Admission medications   Medication Sig Start Date End Date Taking? Authorizing Provider  Albuterol Sulfate (PROAIR HFA IN) Inhale into the lungs as needed.   Yes [provider]  amitriptyline (ELAVIL) 10 MG tablet Take 1 tablet (10 mg total) by mouth at bedtime. Office visit needed for refills 06/22/17  Yes Shawnee Knapp, MD  amoxicillin-clavulanate (AUGMENTIN) 875-125 MG tablet Take 1 tablet by mouth 2 (two) times daily. 08/10/17 08/17/17 Yes Sagardia, Ines Bloomer, MD  SUMAtriptan (IMITREX) 100 MG tablet Take 0.5 tablets (50 mg total) by mouth every 2 (two) hours as needed for migraine. 01/14/17  Yes Sagardia, Ines Bloomer, MD  benzonatate (TESSALON) 100 MG capsule Take by mouth 3 (three) times daily.    [provider]  cetirizine (ZYRTEC) 10 MG tablet TAKE 1 TABLET BY MOUTH EVERY DAY Patient not taking: Reported on 08/10/2017 04/19/17   Shawnee Knapp, MD  methylPREDNISolone (MEDROL  DOSEPAK) 4 MG TBPK tablet Take by mouth.    [provider]  promethazine-codeine (PHENERGAN WITH CODEINE) 6.25-10 MG/5ML syrup Take 5 mLs by mouth at bedtime as needed for cough. Patient not taking: Reported on 08/13/2017 08/10/17   Horald Pollen, MD    Past Medical History:  Diagnosis Date  . Allergy   . Anxiety    Claustophobic - no meds0  . Arthritis    lower back, rupture disk L5, bulging disk L4, no meds  . Bronchitis   . Chest congestion   . GERD (gastroesophageal reflux disease)    occasional, no meds, diet controlled  . Headache    Migraines  . Heart murmur    mild during pregnancy, never caused any problems  . Pneumonia   . Shortness of breath dyspnea   . Sinus congestion     Past Surgical History:  Procedure Laterality Date  . ABDOMINAL HYSTERECTOMY N/A 08/11/2016   Procedure: HYSTERECTOMY ABDOMINAL;  Surgeon: Terrance Mass, MD;  Location: [facility];  Service: Gynecology;  Laterality: N/A;  . BARTHOLIN CYST MARSUPIALIZATION    . BILATERAL SALPINGECTOMY Bilateral 08/11/2016   Procedure: BILATERAL SALPINGECTOMY;  Surgeon: Terrance Mass, MD;  Location: [facility];  Service: Gynecology;  Laterality: Bilateral;  . CESAREAN SECTION     x 1  . WISDOM TOOTH EXTRACTION      Social History  Substance Use Topics  . Smoking status: Never Smoker  . Smokeless tobacco: Never  Used  . Alcohol use 0.0 oz/week     Comment: OCC    Family History  Problem Relation Age of Onset  . Diabetes Mother   . Hyperlipidemia Mother   . Cancer Father   . Hypertension Father   . Cancer Sister   . Hypertension Sister   . Hyperlipidemia Brother     ROS Per HPI  OBJECTIVE:  Blood pressure 114/76, pulse 88, temperature 99.4 F (37.4 C), temperature source Oral, resp. rate 16, height 5\' 7"  (1.702 m), weight 200 lb 6.4 oz (90.9 kg), last menstrual period 08/02/2016, SpO2 96 %.  Physical Exam  Constitutional: She is oriented to person, place, and time and  well-developed, well-nourished, and in no distress.  HENT:  Head: Normocephalic and atraumatic.  Right Ear: Hearing, tympanic membrane, external ear and ear canal normal.  Left Ear: Hearing, tympanic membrane, external ear and ear canal normal.  Mouth/Throat: Oropharynx is clear and moist.  Eyes: Pupils are equal, round, and reactive to light. EOM are normal.  Neck: Neck supple.  Cardiovascular: Normal rate, regular rhythm and normal heart sounds.  Exam reveals no gallop and no friction rub.   No murmur heard. Pulmonary/Chest: Effort normal and breath sounds normal. She has no wheezes. She has no rales.  Lymphadenopathy:    She has no cervical adenopathy.  Neurological: She is alert and oriented to person, place, and time. Gait normal.  Skin: Skin is warm and dry.     ASSESSMENT and PLAN  1. URI with cough and congestion  Reassured patient regarding benign exam and discussed average time course of URI with cough usually being last symptom to resolve. RTC precautions given.   Return if symptoms worsen or fail to improve.    Rutherford Guys, MD [facility] [place], [place] [address] Ph.  [phone] Fax [phone]

## 2017-08-13 NOTE — Patient Instructions (Addendum)
1. Use humidifier at nighttime 2. Trial of elevating head of bed to help manage post nasal drainage 3. Resume zyrtec    IF you received an x-ray today, you will receive an invoice from [facility]. Please contact [facility] at [phone] with questions or concerns regarding your invoice.   IF you received labwork today, you will receive an invoice from [place]. Please contact [facility] at [phone] with questions or concerns regarding your invoice.   Our billing staff will not be able to assist you with questions regarding bills from these companies.  You will be contacted with the lab results as soon as they are available. The fastest way to get your results is to activate your My Chart account. Instructions are located on the last page of this paperwork. If you have not heard from [place] regarding the results in 2 weeks, please contact this office.

## 2017-08-21 ENCOUNTER — Ambulatory Visit (INDEPENDENT_AMBULATORY_CARE_PROVIDER_SITE_OTHER): Payer: Commercial Managed Care - PPO | Admitting: Physician Assistant

## 2017-08-21 ENCOUNTER — Encounter: Payer: Self-pay | Admitting: Physician Assistant

## 2017-08-21 ENCOUNTER — Ambulatory Visit (INDEPENDENT_AMBULATORY_CARE_PROVIDER_SITE_OTHER): Payer: Commercial Managed Care - PPO

## 2017-08-21 VITALS — BP 134/80 | HR 97 | Temp 99.1°F | Resp 18 | Ht 66.26 in | Wt 204.6 lb

## 2017-08-21 DIAGNOSIS — R059 Cough, unspecified: Secondary | ICD-10-CM

## 2017-08-21 DIAGNOSIS — R05 Cough: Secondary | ICD-10-CM

## 2017-08-21 DIAGNOSIS — J209 Acute bronchitis, unspecified: Secondary | ICD-10-CM

## 2017-08-21 DIAGNOSIS — R062 Wheezing: Secondary | ICD-10-CM | POA: Diagnosis not present

## 2017-08-21 LAB — POCT CBC
GRANULOCYTE PERCENT: 72.4 % (ref 37–80)
HCT, POC: 42 % (ref 37.7–47.9)
HEMOGLOBIN: 14.2 g/dL (ref 12.2–16.2)
Lymph, poc: 2.4 (ref 0.6–3.4)
MCH: 28.1 pg (ref 27–31.2)
MCHC: 33.8 g/dL (ref 31.8–35.4)
MCV: 83 fL (ref 80–97)
MID (cbc): 0.6 (ref 0–0.9)
MPV: 7.8 fL (ref 0–99.8)
PLATELET COUNT, POC: 327 10*3/uL (ref 142–424)
POC Granulocyte: 7.8 — AB (ref 2–6.9)
POC LYMPH PERCENT: 22 %L (ref 10–50)
POC MID %: 5.6 %M (ref 0–12)
RBC: 5.06 M/uL (ref 4.04–5.48)
RDW, POC: 14 %
WBC: 10.8 10*3/uL — AB (ref 4.6–10.2)

## 2017-08-21 MED ORDER — AZITHROMYCIN 250 MG PO TABS: ORAL_TABLET | ORAL | 0 refills | Status: DC

## 2017-08-21 MED ORDER — IPRATROPIUM BROMIDE HFA 17 MCG/ACT IN AERS: 2.0000 | INHALATION_SPRAY | Freq: Four times a day (QID) | RESPIRATORY_TRACT | 12 refills | Status: DC | PRN

## 2017-08-21 MED ORDER — IPRATROPIUM BROMIDE 0.02 % IN SOLN
0.5000 mg | Freq: Once | RESPIRATORY_TRACT | Status: AC
  Administered 2017-08-21: 0.5 mg via RESPIRATORY_TRACT

## 2017-08-21 MED ORDER — ALBUTEROL SULFATE (2.5 MG/3ML) 0.083% IN NEBU
2.5000 mg | INHALATION_SOLUTION | Freq: Once | RESPIRATORY_TRACT | Status: AC
  Administered 2017-08-21: 2.5 mg via RESPIRATORY_TRACT

## 2017-08-21 MED ORDER — PREDNISONE 20 MG PO TABS: 40.0000 mg | ORAL_TABLET | Freq: Every day | ORAL | 0 refills | Status: AC

## 2017-08-21 NOTE — Patient Instructions (Addendum)
Due to your lung sounds on physical exam, I believe we should treat with prednisone, which will cover for inflammation, and a Z-Pak,  which will cover for atypical organisms.  Prednisone is a steroid and can cause side effects such as headache, irritability, nausea, vomiting, increased heart rate, increased blood pressure, increased blood sugar, appetite changes, and insomnia. Please take tablets in the morning with a full meal to help decrease the chances of these side effects.   Continue using warm mist humidifier, Tessalon Perles, cough syrup, and Zyrtec daily.  Return to clinic if symptoms worsen, do not improve in 1-2 weeks, or as needed   Cough, Adult A cough helps to clear your throat and lungs. A cough may last only 2-3 weeks (acute), or it may last longer than 8 weeks (chronic). Many different things can cause a cough. A cough may be a sign of an illness or another medical condition. Follow these instructions at home:  Pay attention to any changes in your cough.  Take medicines only as told by your doctor. ? If you were prescribed an antibiotic medicine, take it as told by your doctor. Do not stop taking it even if you start to feel better. ? Talk with your doctor before you try using a cough medicine.  Drink enough fluid to keep your pee (urine) clear or pale yellow.  If the air is dry, use a cold steam vaporizer or humidifier in your home.  Stay away from things that make you cough at work or at home.  If your cough is worse at night, try using extra pillows to raise your head up higher while you sleep.  Do not smoke, and try not to be around smoke. If you need help quitting, ask your doctor.  Do not have caffeine.  Do not drink alcohol.  Rest as needed. Contact a doctor if:  You have new problems (symptoms).  You cough up yellow fluid (pus).  Your cough does not get better after 2-3 weeks, or your cough gets worse.  Medicine does not help your cough and you are  not sleeping well.  You have pain that gets worse or pain that is not helped with medicine.  You have a fever.  You are losing weight and you do not know why.  You have night sweats. Get help right away if:  You cough up blood.  You have trouble breathing.  Your heartbeat is very fast. This information is not intended to replace advice given to you by your health care provider. Make sure you discuss any questions you have with your health care provider. Document Released: 06/18/2011 Document Revised: 03/12/2016 Document Reviewed: 12/12/2014 Elsevier Interactive Patient Education  2018 [employer].  Acute Bronchitis, Adult Acute bronchitis is when air tubes (bronchi) in the lungs suddenly get swollen. The condition can make it hard to breathe. It can also cause these symptoms:  A cough.  Coughing up clear, yellow, or green mucus.  Wheezing.  Chest congestion.  Shortness of breath.  A fever.  Body aches.  Chills.  A sore throat.  Follow these instructions at home: Medicines  Take over-the-counter and prescription medicines only as told by your doctor.  If you were prescribed an antibiotic medicine, take it as told by your doctor. Do not stop taking the antibiotic even if you start to feel better. General instructions  Rest.  Drink enough fluids to keep your pee (urine) clear or pale yellow.  Avoid smoking and secondhand smoke. If  you smoke and you need help quitting, ask your doctor. Quitting will help your lungs heal faster.  Use an inhaler, cool mist vaporizer, or humidifier as told by your doctor.  Keep all follow-up visits as told by your doctor. This is important. How is this prevented? To lower your risk of getting this condition again:  Wash your hands often with soap and water. If you cannot use soap and water, use hand sanitizer.  Avoid contact with people who have cold symptoms.  Try not to touch your hands to your mouth, nose, or  eyes.  Make sure to get the flu shot every year.  Contact a doctor if:  Your symptoms do not get better in 2 weeks. Get help right away if:  You cough up blood.  You have chest pain.  You have very bad shortness of breath.  You become dehydrated.  You faint (pass out) or keep feeling like you are going to pass out.  You keep throwing up (vomiting).  You have a very bad headache.  Your fever or chills gets worse. This information is not intended to replace advice given to you by your health care provider. Make sure you discuss any questions you have with your health care provider. Document Released: 03/23/2008 Document Revised: 05/13/2016 Document Reviewed: 03/25/2016 Elsevier Interactive Patient Education  2017 [employer].   IF you received an x-ray today, you will receive an invoice from [facility]. Please contact [facility] at [phone] with questions or concerns regarding your invoice.   IF you received labwork today, you will receive an invoice from [facility]. Please contact [facility] at [phone] with questions or concerns regarding your invoice.   Our billing staff will not be able to assist you with questions regarding bills from these companies.  You will be contacted with the lab results as soon as they are available. The fastest way to get your results is to activate your My Chart account. Instructions are located on the last page of this paperwork. If you have not heard from [place] regarding the results in 2 weeks, please contact this office.

## 2017-08-21 NOTE — Progress Notes (Signed)
MRN: [id number] DOB: Jul 25, 1971  Subjective:   Crystal Harris is a 46 y.o. female presenting for chief complaint of Cough (X 3 weeks- f/u); Ear Fullness (X 3-4 days); and Sinus Problem .   Has had illness x 4 weeks.  Notes it started as a head cold with sore throat, ear pain, and sinus congestion and then moved to her chest.  She has been coughing now for 3 weeks.  Notes the phlegm she is producing is clear.. She has some associated wheezing.  Has had a fever on and off.  Has tried daily antihistamine, decongestant, increased fluids, inhaler use, NSAIDs, medrol dose pak, cough syrup with codeine, has started using a warm mist humidifier and tessalon for the symptoms with no full relief.  Has started using a warm humidifier, which she thinks is giving her some relief. Denies shortness of breath, chest pain and myalgia, nausea, vomiting, abdominal pain, diarrhea, heartburn, and hempotysis. Has not had sick contact with anyone. Has history of seasonal allergies, takes zyrtec daily. No history of asthma or COPD. Patient has had flu shot this season. Denies smoking.  Denies recent travel.  Denies any other aggravating or relieving factors, no other questions or concerns.  Saylor has a current medication list which includes the following prescription(s): albuterol sulfate, amitriptyline, benzonatate, methylprednisolone, sumatriptan, cetirizine, [facility] fluticasone propionate, and promethazine-codeine. Also has No Known Allergies.  Crystal Harris  has a past medical history of Allergy; Anxiety; Arthritis; Bronchitis; Chest congestion; GERD (gastroesophageal reflux disease); Headache; Heart murmur; Pneumonia; Shortness of breath dyspnea; and Sinus congestion. Also  has a past surgical history that includes Cesarean section; Wisdom tooth extraction; Bartholin cyst marsupialization; Abdominal hysterectomy (N/A, 08/11/2016); and Bilateral salpingectomy (Bilateral, 08/11/2016).   Objective:   Vitals: BP 134/80 (BP  Location: Left Arm, Patient Position: Sitting, Cuff Size: Normal)   Pulse 97   Temp 99.1 F (37.3 C) (Oral)   Resp 18   Ht 5' 6.26" (1.683 m)   Wt 204 lb 9.6 oz (92.8 kg)   LMP 08/02/2016   SpO2 97%   BMI 32.76 kg/m   Physical Exam  Constitutional: She is oriented to person, place, and time. She appears well-developed and well-nourished.  HENT:  Head: Normocephalic and atraumatic.  Right Ear: Tympanic membrane, external ear and ear canal normal.  Left Ear: Tympanic membrane, external ear and ear canal normal.  Nose: Mucosal edema (mild bilaterally) present. Right sinus exhibits no maxillary sinus tenderness and no frontal sinus tenderness. Left sinus exhibits no maxillary sinus tenderness and no frontal sinus tenderness.  Mouth/Throat: Uvula is midline and mucous membranes are normal. Posterior oropharyngeal erythema (cobblestoning noted) present. Tonsils are 1+ on the right. Tonsils are 1+ on the left. No tonsillar exudate.  Eyes: Conjunctivae are normal.  Neck: Normal range of motion.  Cardiovascular: Normal rate, regular rhythm, normal heart sounds and intact distal pulses.   Pulmonary/Chest: Effort normal. No respiratory distress. She has no decreased breath sounds. She has wheezes (ausculated in RUF ). She has rhonchi (few auscultated in RUF, cleared with cough). She has no rales.  Lymphadenopathy:       Head (right side): No submental, no submandibular, no tonsillar, no preauricular, no posterior auricular and no occipital adenopathy present.       Head (left side): No submental, no submandibular, no tonsillar, no preauricular, no posterior auricular and no occipital adenopathy present.    She has no cervical adenopathy.       Right: No supraclavicular adenopathy present.  Left: No supraclavicular adenopathy present.  Neurological: She is alert and oriented to person, place, and time.  Skin: Skin is warm and dry.  Psychiatric: She has a normal mood and affect.  Vitals  reviewed.   Results for orders placed or performed in visit on 08/21/17 (from the past 24 hour(s))  POCT CBC     Status: Abnormal   Collection Time: 08/21/17  3:33 PM  Result Value Ref Range   WBC 10.8 (A) 4.6 - 10.2 K/uL   Lymph, poc 2.4 0.6 - 3.4   POC LYMPH PERCENT 22.0 10 - 50 %L   MID (cbc) 0.6 0 - 0.9   POC MID % 5.6 0 - 12 %M   POC Granulocyte 7.8 (A) 2 - 6.9   Granulocyte percent 72.4 37 - 80 %G   RBC 5.06 4.04 - 5.48 M/uL   Hemoglobin 14.2 12.2 - 16.2 g/dL   HCT, POC 42.0 37.7 - 47.9 %   MCV 83.0 80 - 97 fL   MCH, POC 28.1 27 - 31.2 pg   MCHC 33.8 31.8 - 35.4 g/dL   RDW, POC 14.0 %   Platelet Count, POC 327 142 - 424 K/uL   MPV 7.8 0 - 99.8 fL    Dg Chest 2 View  Result Date: 08/21/2017 CLINICAL DATA:  Patient with cough for 3 weeks. EXAM: CHEST  2 VIEW COMPARISON:  Chest radiograph 03/28/2012 FINDINGS: Stable cardiac and mediastinal contours. No consolidative pulmonary opacities. No pleural effusion or pneumothorax. Thoracic spine degenerative changes. IMPRESSION: No acute cardiopulmonary process. Electronically Signed   By: Lovey Newcomer M.D.   On: 08/21/2017 15:48   Wheezing improved with breathing treatment but still auscultated on exam.   Assessment and Plan :  1. Cough - POCT CBC - DG Chest 2 View; Future - albuterol (PROVENTIL) (2.5 MG/3ML) 0.083% nebulizer solution 2.5 mg; Take 3 mLs (2.5 mg total) by nebulization once. - ipratropium (ATROVENT) nebulizer solution 0.5 mg; Take 2.5 mLs (0.5 mg total) by nebulization once. 2. Wheezing Improved but still present. - albuterol (PROVENTIL) (2.5 MG/3ML) 0.083% nebulizer solution 2.5 mg; Take 3 mLs (2.5 mg total) by nebulization once. - ipratropium (ATROVENT) nebulizer solution 0.5 mg; Take 2.5 mLs (0.5 mg total) by nebulization once. - predniSONE (DELTASONE) 20 MG tablet; Take 2 tablets (40 mg total) by mouth daily with breakfast.  Dispense: 10 tablet; Refill: 0 - ipratropium (ATROVENT HFA) 17 MCG/ACT inhaler; Inhale  2 puffs into the lungs every 6 (six) hours as needed for wheezing.  Dispense: 1 Inhaler; Refill: 12 3. Acute bronchitis, unspecified organism Chest x-ray no acute cardiopulmonary process, which is reassuring.  WBC mildly elevated at 10.8.  Due to duration of symptoms, will treat with a Z-Pak at this time to cover for atypical organisms.  We will also extend prednisone course by 5 additional days due to continued wheezing.  Given Rx for Atrovent inhaler.  Educated to use both Atrovent and albuterol inhaler every 4-6 hours as needed for wheezing.  Educated that bronchitis can last anywhere from 2-8 weeks. Patient encouraged to return to the office if symptoms persist despite treatment or if she develops new concerning symptoms.  Continue using warm mist humidifier, Tessalon Perles, cough syrup with codeine, and Zyrtec as prescribed.   - azithromycin (ZITHROMAX) 250 MG tablet; Take 2 tabs PO x 1 dose, then 1 tab PO QD x 4 days  Dispense: 6 tablet; Refill: 0  Tenna Delaine, PA-C  [facility] 08/21/2017 6:06 PM

## 2017-08-23 ENCOUNTER — Telehealth: Payer: Self-pay | Admitting: Physician Assistant

## 2017-08-23 NOTE — Telephone Encounter (Signed)
Pt saw [facility] on Saturday and the inhaler that she prescribed is expensive and would like [facility] to prescribe a cheaper one   Best number [phone]

## 2017-08-23 NOTE — Telephone Encounter (Signed)
Please call pt back. Unfortunately there is no other inhaler of that kind that is less expensive. Honestly, she will be fine with just using the albuterol inhaler since the atrovent is so expensive. She will still get the benefit she needs with the albuterol by itself. Please let me know she has any questions. Thanks!

## 2017-08-23 NOTE — Telephone Encounter (Signed)
See message below °

## 2017-08-24 NOTE — Telephone Encounter (Signed)
Pt advised.

## 2017-09-27 ENCOUNTER — Ambulatory Visit: Payer: Commercial Managed Care - PPO

## 2017-11-01 ENCOUNTER — Ambulatory Visit
Admission: RE | Admit: 2017-11-01 | Discharge: 2017-11-01 | Disposition: A | Discharge status: Home / Self Care | Payer: Commercial Managed Care - PPO | Source: Ambulatory Visit | Attending: Obstetrics & Gynecology | Admitting: Obstetrics & Gynecology

## 2017-11-01 DIAGNOSIS — Z1231 Encounter for screening mammogram for malignant neoplasm of breast: Secondary | ICD-10-CM

## 2018-02-22 ENCOUNTER — Ambulatory Visit: Payer: Self-pay

## 2018-02-22 ENCOUNTER — Emergency Department (HOSPITAL_COMMUNITY)
Admission: EM | Admit: 2018-02-22 | Discharge: 2018-02-22 | Disposition: A | Discharge status: Home / Self Care | Payer: Commercial Managed Care - PPO | Attending: Emergency Medicine | Admitting: Emergency Medicine

## 2018-02-22 ENCOUNTER — Encounter (HOSPITAL_COMMUNITY): Payer: Self-pay

## 2018-02-22 ENCOUNTER — Other Ambulatory Visit: Payer: Self-pay

## 2018-02-22 ENCOUNTER — Emergency Department (HOSPITAL_COMMUNITY): Payer: Commercial Managed Care - PPO

## 2018-02-22 ENCOUNTER — Ambulatory Visit: Payer: Commercial Managed Care - PPO | Admitting: Family Medicine

## 2018-02-22 DIAGNOSIS — R51 Headache: Secondary | ICD-10-CM | POA: Diagnosis not present

## 2018-02-22 DIAGNOSIS — Z79899 Other long term (current) drug therapy: Secondary | ICD-10-CM | POA: Insufficient documentation

## 2018-02-22 DIAGNOSIS — R202 Paresthesia of skin: Secondary | ICD-10-CM | POA: Diagnosis not present

## 2018-02-22 DIAGNOSIS — R42 Dizziness and giddiness: Secondary | ICD-10-CM | POA: Diagnosis present

## 2018-02-22 LAB — BASIC METABOLIC PANEL
Anion gap: 10 (ref 5–15)
BUN: 12 mg/dL (ref 6–20)
CALCIUM: 9.8 mg/dL (ref 8.9–10.3)
CO2: 27 mmol/L (ref 22–32)
CREATININE: 1.12 mg/dL — AB (ref 0.44–1.00)
Chloride: 102 mmol/L (ref 101–111)
GFR calc Af Amer: >60 mL/min (ref >60)
GFR calc non Af Amer: 57 mL/min — ABNORMAL LOW (ref >60)
Glucose, Bld: 167 mg/dL — ABNORMAL HIGH (ref 65–99)
Potassium: 4 mmol/L (ref 3.5–5.1)
SODIUM: 139 mmol/L (ref 135–145)

## 2018-02-22 LAB — CBC
HCT: 45.8 % (ref 36.0–46.0)
HEMOGLOBIN: 15.4 g/dL — AB (ref 12.0–15.0)
MCH: 28.7 pg (ref 26.0–34.0)
MCHC: 33.6 g/dL (ref 30.0–36.0)
MCV: 85.3 fL (ref 78.0–100.0)
Platelets: 312 10*3/uL (ref 150–400)
RBC: 5.37 MIL/uL — ABNORMAL HIGH (ref 3.87–5.11)
RDW: 13.8 % (ref 11.5–15.5)
WBC: 8.5 10*3/uL (ref 4.0–10.5)

## 2018-02-22 NOTE — Telephone Encounter (Signed)
Pt. Called to report c/o dizziness and headache.  Reported the dizziness is like Vertigo.  Stated she was evaluated for dizziness in past, and they could not pinpoint the cause.  Reported she started not feeling well over the weekend.  Stated she has dizziness that varies with positioning; "if I  look down, or to the right, it is worse."  Reported she has moments that she feels she could "black out."  Reported hx of migraines, but stated this feels different than her typical migraine.  Described headache as "dull and constant on top, and in the front of her head."  Questioned about numbness or weakness in extremities.  Stated she had numbness in left leg x 4-5 hours on Saturday, and numbness in left arm x 5-6 hours on Sunday.  Stated there is no numbness at this time.  Reported she thought she should call due to dizziness and headache continuing. Advised with combination of symptoms, including episodes of numbness of left sided extremities, she should be evaluated in the ER.  Pt. Reported she has someone to take her this afternoon.  Encouraged to go to the ER sooner than later.  Verb. Understanding.            Reason for Disposition . [1] Dizziness (vertigo) present now AND [2] one or more stroke risk factors (i.e., hypertension, diabetes, prior stroke/TIA/heart attack)  (Exception: prior physician evaluation for this AND no different/worse than usual)  Answer Assessment - Initial Assessment Questions 1. DESCRIPTION: "Describe your dizziness."     Has moments she feels like she might black out; cannot look down for very long or turns head to the right the dizziness increases 2. VERTIGO: "Do you feel like either you or the room is spinning or tilting?"      Intermittent feeling of room tilting or spinning 3. LIGHTHEADED: "Do you feel lightheaded?" (e.g., somewhat faint, woozy, weak upon standing)    "On and off has feeling like she could faint"; this has been going on for years, but now is a bit worse 4.  SEVERITY: "How bad is it?"  "Can you walk?"   - MILD - Feels unsteady but walking normally.   - MODERATE - Feels very unsteady when walking, but not falling; interferes with normal activities (e.g., school, work) .   - SEVERE - Unable to walk without falling (requires assistance).    Moderate 5. ONSET:  "When did the dizziness begin?"     Started with headache and not feeling good over the weekend; late Saturday into Sun.  6. AGGRAVATING FACTORS: "Does anything make it worse?" (e.g., standing, change in head position)     Sinuses have been flared for several weeks ; questions if sinus issue could be contributing; denied sinus pressure at present; takes Zyrtec daily  7. CAUSE: "What do you think is causing the dizziness?"     unknown 8. RECURRENT SYMPTOM: "Have you had dizziness before?" If so, ask: "When was the last time?" "What happened that time?"     yes 9. OTHER SYMPTOMS: "Do you have any other symptoms?" (e.g., headache, weakness, numbness, vomiting, earache)    Denied nausea or vomiting; c/o headache 4/10 at present; c/o numbness in left leg on Saturday evening x 4-5 hrs. ; numbness in left arm on Sunday x 5-6 hrs.  Reported back issues and has a catch in her back that is like a nagging discomfort.  Reported hx of migraines, this HA is not the same as her migraines; dull headache  that won't go away.  10. PREGNANCY: "Is there any chance you are pregnant?" "When was your last menstrual period?"       Hx of hysterectomy 2017  Protocols used: DIZZINESS - VERTIGO-A-AH

## 2018-02-22 NOTE — Discharge Instructions (Signed)
Follow-up with your primary care doctor or consider seeing a neurologist for further evaluation, return to the emergency room for worsening symptoms including weakness or speech difficulty.

## 2018-02-22 NOTE — ED Notes (Signed)
ED Provider at bedside. 

## 2018-02-22 NOTE — ED Notes (Signed)
No answer for treatment room. 

## 2018-02-22 NOTE — ED Provider Notes (Signed)
Patient placed in Quick Look pathway, seen and evaluated   Chief Complaint: HA, paresthesias, lightheadedness  HPI: Patient with history of migraine HA, intermittent lightheadedness -- presents with c/o HA x 3 days, different that usual migraine. She has had episodes of numbness and tingling, starting in her left leg 3 days ago and resolving, subsequently in her left arm following day, now resolved.  She did not have any associated weakness.  Tingling was not a complete numbness.  Headache has been waxing and waning and patient thinks that her seasonal allergies may be playing a part.  She has lightheadedness that is worse when she bends over, no full syncope. Patient denies signs of stroke including: facial droop, slurred speech, aphasia, weakness/complete numbness in extremities, imbalance/trouble walking.   ROS:  Positive ROS: (+) HA, lightheadedness, tingling Negative ROS: (-) N/V, CP, SOB  Physical Exam:   Gen: No distress  Neuro: Awake and Alert  Skin: Warm    Focused Exam: Heart RRR, nml S1,S2, no m/r/g; Lungs CTAB; Abd soft, NT, no rebound or guarding; Ext 2+ pedal pulses bilaterally, no edema; Neuro AAOx4, normal upper and lower extremity motor and neuro, CN II-XII grossly intact.  BP (!) 150/92 (BP Location: Right Arm)   Pulse (!) 101   Temp 98.7 F (37.1 C) (Oral)   Resp 18   LMP 08/02/2016   SpO2 100%   Plan: atypical HA with paresthesias that are not typical for patient.  Patient also with episodes of lightheadedness without significant vertigo.  No full syncope.  Head CT ordered as well as labs.   Initiation of care has begun. The patient has been counseled on the process, plan, and necessity for staying for the completion/evaluation, and the remainder of the medical screening examination    Carlisle Cater, PA-C 02/22/18 [place]    Drenda Freeze, MD 02/22/18 1539

## 2018-02-22 NOTE — ED Provider Notes (Signed)
[facility] EMERGENCY DEPARTMENT Provider Note   CSN: [id number] Arrival date & time: 02/22/18  1357     History   Chief Complaint Chief Complaint  Patient presents with  . Headache  . Dizziness    HPI Crystal Harris is a 47 y.o. female.  HPI Pt started having a headache a few days ago but it was not her typical migraine.  It was not very severe so she did not think too much of it.  Over the weekend she had some numbness and tingling in her left leg, then her left arm the following day. No weakness.   SHe had some lightheadedness worse when bending over.  She planned on going to an urgent care but they recommended she come to the ED.  She is feeling better now.  Her headache is at a 1.  No trouble with speech.   No trouble with balance.   Past Medical History:  Diagnosis Date  . Allergy   . Anxiety    Claustophobic - no meds0  . Arthritis    lower back, rupture disk L5, bulging disk L4, no meds  . Bronchitis   . Chest congestion   . GERD (gastroesophageal reflux disease)    occasional, no meds, diet controlled  . Headache    Migraines  . Heart murmur    mild during pregnancy, never caused any problems  . Pneumonia   . Shortness of breath dyspnea   . Sinus congestion     Patient Active Problem List   Diagnosis Date Noted  . Mixed hyperlipidemia 05/14/2017  . Hyperglycemia 05/14/2017    Past Surgical History:  Procedure Laterality Date  . ABDOMINAL HYSTERECTOMY N/A 08/11/2016   Procedure: HYSTERECTOMY ABDOMINAL;  Surgeon: Terrance Mass, MD;  Location: [facility];  Service: Gynecology;  Laterality: N/A;  . BARTHOLIN CYST MARSUPIALIZATION    . BILATERAL SALPINGECTOMY Bilateral 08/11/2016   Procedure: BILATERAL SALPINGECTOMY;  Surgeon: Terrance Mass, MD;  Location: [facility];  Service: Gynecology;  Laterality: Bilateral;  . CESAREAN SECTION     x 1  . WISDOM TOOTH EXTRACTION       OB History    Gravida  3   Para  2   Term      Preterm     AB  1   Living  2     SAB      TAB      Ectopic      Multiple      Live Births               Home Medications    Prior to Admission medications   Medication Sig Start Date End Date Taking? Authorizing Provider  amitriptyline (ELAVIL) 10 MG tablet Take 1 tablet (10 mg total) by mouth at bedtime. Office visit needed for refills 06/22/17  Yes Shawnee Oaklyn Mans, MD  ipratropium (ATROVENT HFA) 17 MCG/ACT inhaler Inhale 2 puffs into the lungs every 6 (six) hours as needed for wheezing. 08/21/17  Yes Tenna Delaine D, PA-C  SUMAtriptan (IMITREX) 100 MG tablet Take 0.5 tablets (50 mg total) by mouth every 2 (two) hours as needed for migraine. 01/14/17  Yes Sagardia, Ines Bloomer, MD  Albuterol Sulfate (PROAIR HFA IN) Inhale into the lungs as needed.    [provider]  azithromycin (ZITHROMAX) 250 MG tablet Take 2 tabs PO x 1 dose, then 1 tab PO QD x 4 days 08/21/17   Leonie Douglas, PA-C  benzonatate (TESSALON) 100 MG capsule Take by mouth 3 (three) times daily.    [provider]  cetirizine (ZYRTEC) 10 MG tablet TAKE 1 TABLET BY MOUTH EVERY DAY Patient not taking: Reported on 08/10/2017 04/19/17   Shawnee Paytan Recine, MD  [facility] FLUTICASONE PROPIONATE 50 MCG/ACT nasal spray USE 2 SPRAYS EACH NOSTRIL ONCE DAILY AS NEEDED 07/29/17   [provider]  promethazine-codeine (PHENERGAN WITH CODEINE) 6.25-10 MG/5ML syrup Take 5 mLs by mouth at bedtime as needed for cough. Patient not taking: Reported on 08/13/2017 08/10/17   Horald Pollen, MD    Family History Family History  Problem Relation Age of Onset  . Diabetes Mother   . Hyperlipidemia Mother   . Cancer Father   . Hypertension Father   . Cancer Sister   . Hypertension Sister   . Hyperlipidemia Brother   . Breast cancer Paternal Grandmother     Social History Social History   Tobacco Use  . Smoking status: Never Smoker  . Smokeless tobacco: Never Used  Substance Use Topics  . Alcohol use: Yes     Alcohol/week: 0.0 oz    Comment: OCC  . Drug use: No     Allergies   Patient has no known allergies.   Review of Systems Review of Systems  All other systems reviewed and are negative.    Physical Exam Updated Vital Signs BP 128/86 (BP Location: Right Arm)   Pulse 84   Temp 98.2 F (36.8 C) (Oral)   Resp 16   LMP 08/02/2016   SpO2 100%   Physical Exam  Constitutional: She is oriented to person, place, and time. She appears well-developed and well-nourished. No distress.  HENT:  Head: Normocephalic and atraumatic.  Right Ear: External ear normal.  Left Ear: External ear normal.  Mouth/Throat: Oropharynx is clear and moist.  Eyes: Conjunctivae are normal. Right eye exhibits no discharge. Left eye exhibits no discharge. No scleral icterus.  Neck: Neck supple. No tracheal deviation present.  Cardiovascular: Normal rate, regular rhythm and intact distal pulses.  Pulmonary/Chest: Effort normal and breath sounds normal. No stridor. No respiratory distress. She has no wheezes. She has no rales.  Abdominal: Soft. Bowel sounds are normal. She exhibits no distension. There is no tenderness. There is no rebound and no guarding.  Musculoskeletal: She exhibits no edema or tenderness.  Neurological: She is alert and oriented to person, place, and time. She has normal strength. No cranial nerve deficit (no facial droop, extraocular movements intact, no slurred speech) or sensory deficit. She exhibits normal muscle tone. She displays no seizure activity. Coordination normal.  No pronator drift bilateral upper extrem, able to hold both legs off bed for 5 seconds, sensation intact in all extremities, no visual field cuts, no left or right sided neglect, normal finger-nose exam bilaterally, no nystagmus noted   Skin: Skin is warm and dry. No rash noted.  Psychiatric: She has a normal mood and affect.  Nursing note and vitals reviewed.    ED Treatments / Results  Labs (all labs ordered  are listed, but only abnormal results are displayed) Labs Reviewed  CBC - Abnormal; Notable for the following components:      Result Value   RBC 5.37 (*)    Hemoglobin 15.4 (*)    All other components within normal limits  BASIC METABOLIC PANEL - Abnormal; Notable for the following components:   Glucose, Bld 167 (*)    Creatinine, Ser 1.12 (*)    GFR calc  non Af Amer 57 (*)    All other components within normal limits    EKG None  Radiology Ct Head Wo Contrast  Result Date: 02/22/2018 CLINICAL DATA:  Headache, dizziness, left leg numbness. EXAM: CT HEAD WITHOUT CONTRAST TECHNIQUE: Contiguous axial images were obtained from the base of the skull through the vertex without intravenous contrast. COMPARISON:  MRI of Feb 28, 2014.  CT scan of November 07, 2004. FINDINGS: Brain: No evidence of acute infarction, hemorrhage, hydrocephalus, extra-axial collection or mass lesion/mass effect. Vascular: No hyperdense vessel or unexpected calcification. Skull: Normal. Negative for fracture or focal lesion. Sinuses/Orbits: No acute finding. Other: None. IMPRESSION: Normal head CT. Electronically Signed   By: Marijo Conception, M.D.   On: 02/22/2018 16:13    Procedures Procedures (including critical care time)  Medications Ordered in ED Medications - No data to display   Initial Impression / Assessment and Plan / ED Course  I have reviewed the triage vital signs and the nursing notes.  Pertinent labs & imaging results that were available during my care of the patient were reviewed by me and considered in my medical decision making (see chart for details).    Patient presented to emergency room for evaluation of numbness that occurred over the weekend.  Patient's symptoms have resolved.  She has no neurologic deficits on exam.  Laboratory tests and CT scans are reassuring.  Patient does have history of migraines.  It is possible her numbness was associated with a complex migraine.  I have low  suspicion for stroke, TIA.  Discussed outpatient follow-up with her primary care doctor.  Consider seeing a neurologist if she has any recurrence and patient should return to the emergency room if she has any weakness, difficulty with speech or other worrisome findings.  Final Clinical Impressions(s) / ED Diagnoses   Final diagnoses:  Paresthesia    ED Discharge Orders    None       Dorie Rank, MD 02/22/18 2208

## 2018-02-22 NOTE — ED Triage Notes (Signed)
Pt reports she has had constant headache for several days. She states dizziness with position change, especially bending over. Pt reports she also had a period of numbness in her left leg on Saturday. Pt ambulatory, AOX4.

## 2018-03-27 IMAGING — DX DG CHEST 2V
2 series · 2 of 2 positions shown · non-contrast
Comparison: Chest radiograph 03/28/2012

CLINICAL DATA: Patient with cough for 3 weeks.

EXAM:
CHEST  2 VIEW

[chest pa]
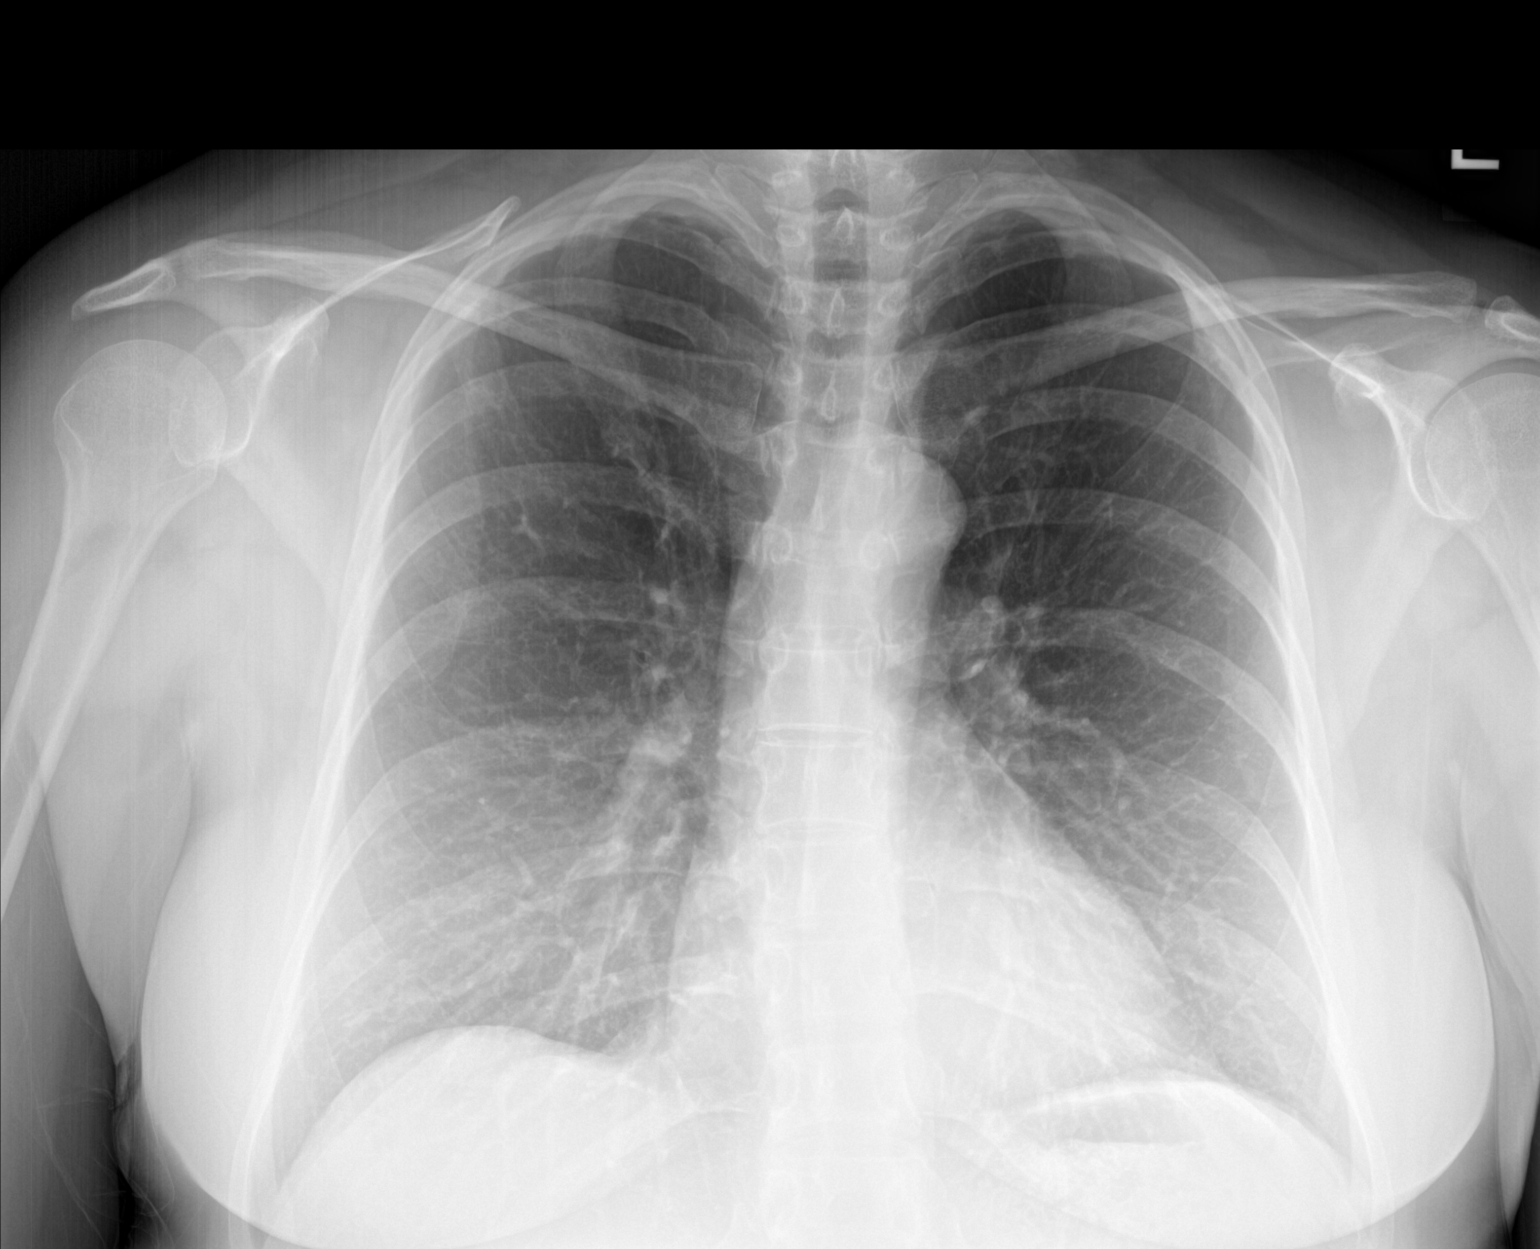

[chest lat]
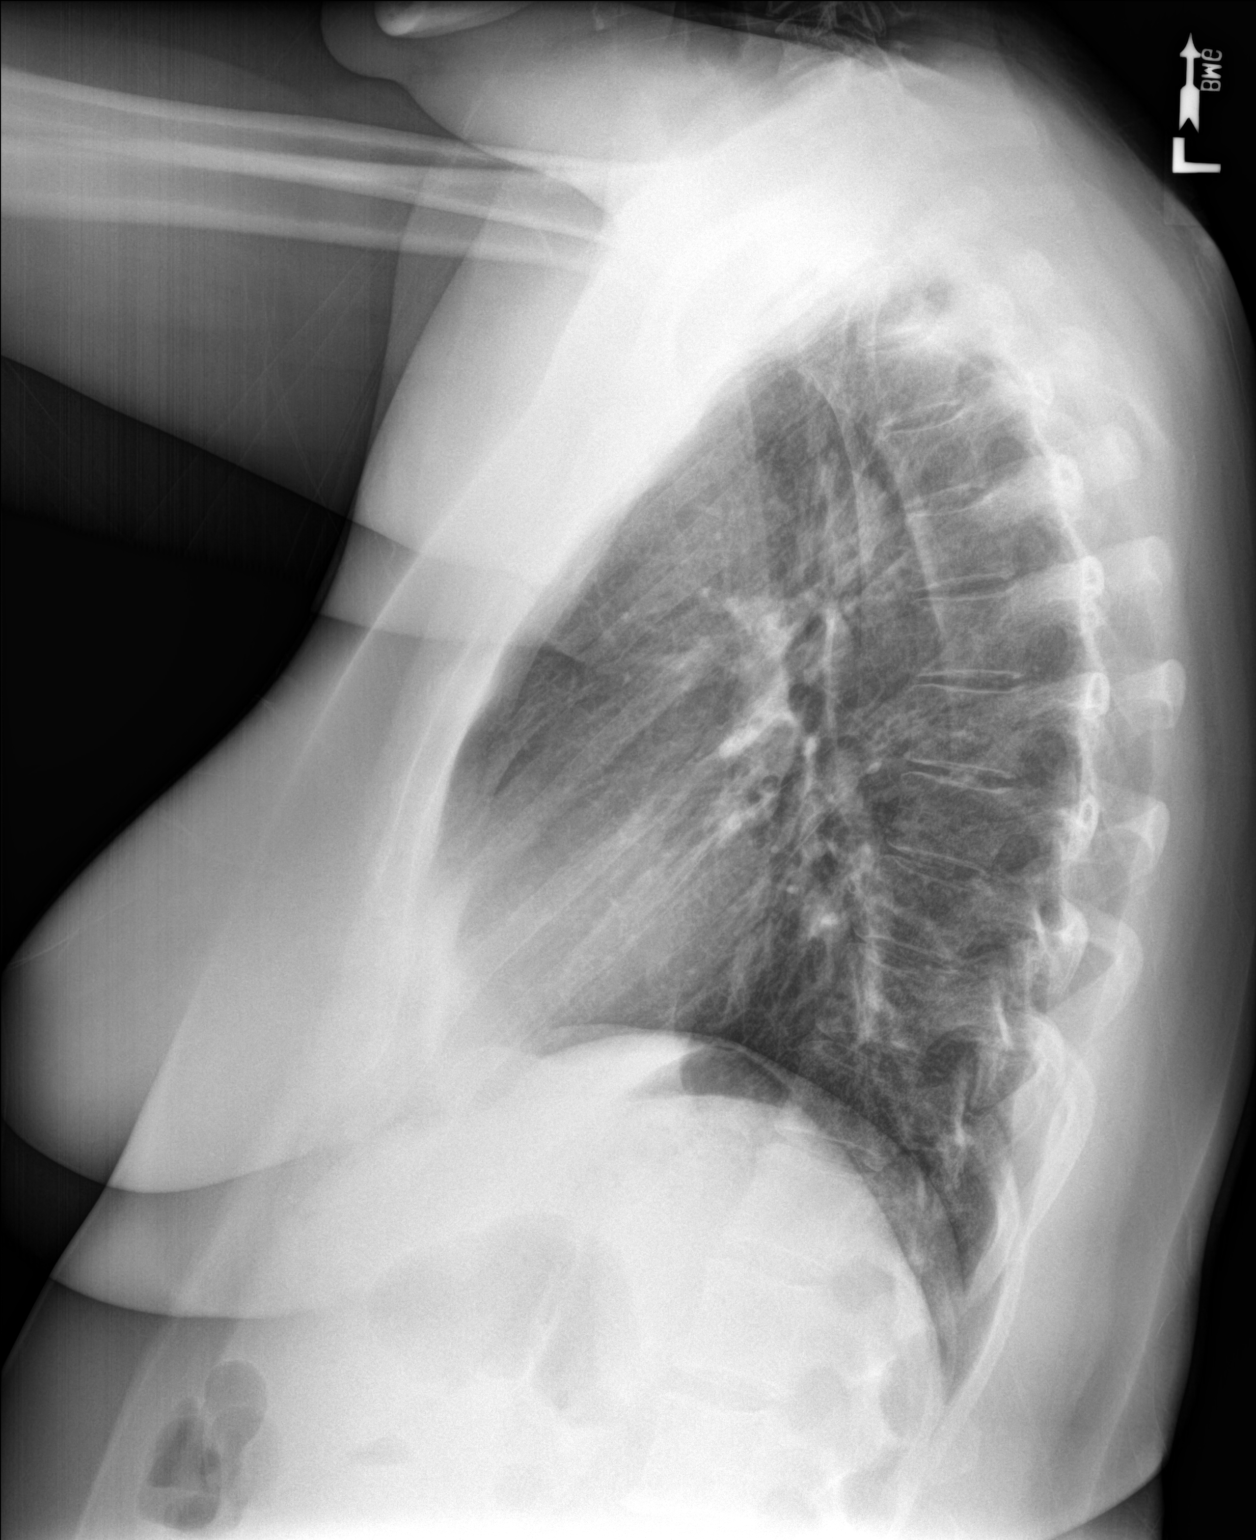

[2 of 2 positions shown; findings below may reference images not displayed]

FINDINGS: Stable cardiac and mediastinal contours. No consolidative pulmonary
opacities. No pleural effusion or pneumothorax. Thoracic spine
degenerative changes.
IMPRESSION: No acute cardiopulmonary process.

## 2018-04-22 ENCOUNTER — Ambulatory Visit: Payer: Commercial Managed Care - PPO | Admitting: Diagnostic Neuroimaging

## 2018-05-18 ENCOUNTER — Encounter: Payer: Self-pay | Admitting: Obstetrics & Gynecology

## 2018-05-18 ENCOUNTER — Ambulatory Visit (INDEPENDENT_AMBULATORY_CARE_PROVIDER_SITE_OTHER): Payer: Commercial Managed Care - PPO | Admitting: Obstetrics & Gynecology

## 2018-05-18 VITALS — BP 118/78 | Ht 66.0 in | Wt 207.0 lb

## 2018-05-18 DIAGNOSIS — Z90710 Acquired absence of both cervix and uterus: Secondary | ICD-10-CM | POA: Diagnosis not present

## 2018-05-18 DIAGNOSIS — Z6833 Body mass index (BMI) 33.0-33.9, adult: Secondary | ICD-10-CM

## 2018-05-18 DIAGNOSIS — E6609 Other obesity due to excess calories: Secondary | ICD-10-CM

## 2018-05-18 DIAGNOSIS — Z01419 Encounter for gynecological examination (general) (routine) without abnormal findings: Secondary | ICD-10-CM | POA: Diagnosis not present

## 2018-05-18 NOTE — Progress Notes (Signed)
JADALYN OLIVERI May 31, 1971 [id number]   History:    47 y.o. G3P2A1L2 Boyfriend of 13 yrs.  Sons 23+ and 5 yo.  RP:  Established patient presenting for annual gyn exam   HPI: S/P TAH/Bilateral salpingectomy for Fibroids in 09/2016.  No vasomotor menopausal Sx.  No pelvic pain.  No pain with IC.  Urine/BMs wnl.  Breasts wnl.  BMI 33.41.  Will do Health labs at work tomorrow and fax them to me.  Past medical history,surgical history, family history and social history were all reviewed and documented in the EPIC chart.  Gynecologic History Patient's last menstrual period was 08/02/2016. Contraception: status post hysterectomy Last Pap: 05/2016. Results were: Negative, HPV HR neg Last mammogram: 10/2017.  Results were: Negative Bone Density: Never Colonoscopy: Never  Obstetric History OB History  Gravida Para Term Preterm AB Living  3 2     1 2   SAB TAB Ectopic Multiple Live Births               # Outcome Date GA Lbr Len/2nd Weight Sex Delivery Anes PTL Lv  3 AB           2 Para           1 Para              ROS: A ROS was performed and pertinent positives and negatives are included in the history.  GENERAL: No fevers or chills. HEENT: No change in vision, no earache, sore throat or sinus congestion. NECK: No pain or stiffness. CARDIOVASCULAR: No chest pain or pressure. No palpitations. PULMONARY: No shortness of breath, cough or wheeze. GASTROINTESTINAL: No abdominal pain, nausea, vomiting or diarrhea, melena or bright red blood per rectum. GENITOURINARY: No urinary frequency, urgency, hesitancy or dysuria. MUSCULOSKELETAL: No joint or muscle pain, no back pain, no recent trauma. DERMATOLOGIC: No rash, no itching, no lesions. ENDOCRINE: No polyuria, polydipsia, no heat or cold intolerance. No recent change in weight. HEMATOLOGICAL: No anemia or easy bruising or bleeding. NEUROLOGIC: No headache, seizures, numbness, tingling or weakness. PSYCHIATRIC: No depression, no loss of interest  in normal activity or change in sleep pattern.     Exam:   BP 118/78   Ht 5\' 6"  (1.676 m)   Wt 207 lb (93.9 kg)   LMP 08/02/2016   BMI 33.41 kg/m   Body mass index is 33.41 kg/m.  General appearance : Well developed well nourished female. No acute distress HEENT: Eyes: no retinal hemorrhage or exudates,  Neck supple, trachea midline, no carotid bruits, no thyroidmegaly Lungs: Clear to auscultation, no rhonchi or wheezes, or rib retractions  Heart: Regular rate and rhythm, no murmurs or gallops Breast:Examined in sitting and supine position were symmetrical in appearance, no palpable masses or tenderness,  no skin retraction, no nipple inversion, no nipple discharge, no skin discoloration, no axillary or supraclavicular lymphadenopathy Abdomen: no palpable masses or tenderness, no rebound or guarding Extremities: no edema or skin discoloration or tenderness  Pelvic: Vulva: Normal             Vagina: No gross lesions or discharge  Cervix/Uterus  Adnexa  Without masses or tenderness  Anus: Normal   Assessment/Plan:  47 y.o. female for annual exam   1. Well female exam with routine gynecological exam Gynecologic exam status post TAH with bilateral salpingectomy.  Pap test was negative with negative high-risk HPV in August 2017.  Breast exam normal.  Last screening mammogram in January 2019 was negative.  Will do health labs at the office tomorrow and send the results to me.  2. S/P TAH (total abdominal hysterectomy)  3. Class 1 obesity due to excess calories without serious comorbidity with body mass index (BMI) of 33.0 to 33.9 in adult Recommend low calorie/low carb diet such as [employer].  Recommend physical activity with aerobic activities 5 times a week and weightlifting every 2 days.  [name] MD, 4:01 PM 05/18/2018

## 2018-05-18 NOTE — Patient Instructions (Addendum)
1. Well female exam with routine gynecological exam Gynecologic exam status post TAH with bilateral salpingectomy.  Pap test was negative with negative high-risk HPV in August 2017.  Breast exam normal.  Last screening mammogram in January 2019 was negative.  Will do health labs at the office tomorrow and send the results to me.  2. S/P TAH (total abdominal hysterectomy)  3. Class 1 obesity due to excess calories without serious comorbidity with body mass index (BMI) of 33.0 to 33.9 in adult Recommend low calorie/low carb diet such as [employer].  Recommend physical activity with aerobic activities 5 times a week and weightlifting every 2 days.  Karen, it was a pleasure meeting you today!

## 2018-05-19 ENCOUNTER — Encounter: Payer: Self-pay | Admitting: Obstetrics & Gynecology

## 2018-05-31 ENCOUNTER — Encounter: Payer: Self-pay | Admitting: Diagnostic Neuroimaging

## 2018-05-31 ENCOUNTER — Ambulatory Visit: Payer: Commercial Managed Care - PPO | Admitting: Diagnostic Neuroimaging

## 2018-05-31 VITALS — BP 142/78 | HR 74 | Ht 67.0 in | Wt 208.0 lb

## 2018-05-31 DIAGNOSIS — R200 Anesthesia of skin: Secondary | ICD-10-CM

## 2018-05-31 DIAGNOSIS — G43109 Migraine with aura, not intractable, without status migrainosus: Secondary | ICD-10-CM | POA: Diagnosis not present

## 2018-05-31 MED ORDER — SUMATRIPTAN SUCCINATE 50 MG PO TABS: ORAL_TABLET | ORAL | 6 refills | Status: DC

## 2018-05-31 MED ORDER — AMITRIPTYLINE HCL 25 MG PO TABS: 25.0000 mg | ORAL_TABLET | Freq: Every day | ORAL | 12 refills | Status: DC

## 2018-05-31 NOTE — Progress Notes (Signed)
[facility]  PATIENT: Crystal Harris DOB: November 23, 1970  REFERRING CLINICIAN: ER  HISTORY FROM: patient  REASON FOR VISIT: new consult    HISTORICAL  CHIEF COMPLAINT:  Chief Complaint  Patient presents with  . Paresthesia    rm 7, New Pt, "dizziness off and on, balance issues, sometimes vertigo, migraines, allergies, sinus issues    HISTORY OF PRESENT ILLNESS:   47 year old female here for evaluation of dizziness, vertigo, headaches.  Patient has history of migraine headaches, on the top of her head, throbbing sensation associate with nausea, photophobia, phonophobia.  She has about 6 of these per month which are mild and one severe one every 3 to 4 months.  She uses amitriptyline and sumatriptan with fairly good results.  Patient also having an episode of left arm and leg weakness lasting for 1 to 2 days in May 2019.  This was associated with headache.  Patient went to the emergency room had CT scan head which is unremarkable.  Patient was referred to neurology clinic for further evaluation.    REVIEW OF SYSTEMS: Full 14 system review of systems performed and negative with exception of: Snoring restless legs memory loss headache dizziness passing out anxiety allergies constipation ringing in ears chest pain palpitations weight gain fatigue blurred vision.  ALLERGIES: No Known Allergies  HOME MEDICATIONS: Outpatient Medications Prior to Visit  Medication Sig Dispense Refill  . amitriptyline (ELAVIL) 10 MG tablet Take 1 tablet (10 mg total) by mouth at bedtime. Office visit needed for refills 30 tablet 0  . cetirizine (ZYRTEC) 10 MG chewable tablet Chew 10 mg by mouth daily.    . SUMAtriptan (IMITREX) 100 MG tablet Take 0.5 tablets (50 mg total) by mouth every 2 (two) hours as needed for migraine. 10 tablet 3   No facility-administered medications prior to visit.     PAST MEDICAL HISTORY: Past Medical History:  Diagnosis Date  . Allergy   . Anxiety      Claustophobic - no meds0  . Arthritis    lower back, rupture disk L5, bulging disk L4, no meds  . Bronchitis   . Chest congestion   . GERD (gastroesophageal reflux disease)    occasional, no meds, diet controlled  . Headache    Migraines  . Heart murmur    mild during pregnancy, never caused any problems  . Hypercholesterolemia   . Pneumonia   . Shortness of breath dyspnea   . Sinus congestion     PAST SURGICAL HISTORY: Past Surgical History:  Procedure Laterality Date  . ABDOMINAL HYSTERECTOMY N/A 08/11/2016   Procedure: HYSTERECTOMY ABDOMINAL;  Surgeon: Terrance Mass, MD;  Location: [facility];  Service: Gynecology;  Laterality: N/A;  . BARTHOLIN CYST MARSUPIALIZATION    . BILATERAL SALPINGECTOMY Bilateral 08/11/2016   Procedure: BILATERAL SALPINGECTOMY;  Surgeon: Terrance Mass, MD;  Location: [facility];  Service: Gynecology;  Laterality: Bilateral;  . CESAREAN SECTION     x 1  . WISDOM TOOTH EXTRACTION      FAMILY HISTORY: Family History  Problem Relation Age of Onset  . Diabetes Mother   . Hyperlipidemia Mother   . Emphysema Mother   . Cancer Father        brain  . Hypertension Father   . Cancer Sister        lung to brain  . Hypertension Sister   . Hyperlipidemia Brother   . Breast cancer Paternal Grandmother   . Blindness Paternal Grandmother   . Emphysema  Maternal Grandmother   . Stroke Maternal Grandfather     SOCIAL HISTORY: Social History   Socioeconomic History  . Marital status: Divorced    Spouse name: Not on file  . Number of children: 2  . Years of education: comm college  . Highest education level: Not on file  Occupational History    Comment: [employer]  . Financial resource strain: Not on file  . Food insecurity:    Worry: Not on file    Inability: Not on file  . Transportation needs:    Medical: Not on file    Non-medical: Not on file  Tobacco Use  . Smoking status: Never Smoker  . Smokeless tobacco:  Never Used  Substance and Sexual Activity  . Alcohol use: Yes    Alcohol/week: 0.0 standard drinks    Comment: minimal  . Drug use: No    Comment: none since early 20's  . Sexual activity: Yes    Partners: Male    Birth control/protection: None    Comment: 1st intercourse- 86, partners- 24, current partner- 13 yrs   Lifestyle  . Physical activity:    Days per week: Not on file    Minutes per session: Not on file  . Stress: Not on file  Relationships  . Social connections:    Talks on phone: Not on file    Gets together: Not on file    Attends religious service: Not on file    Active member of club or organization: Not on file    Attends meetings of clubs or organizations: Not on file    Relationship status: Not on file  . Intimate partner violence:    Fear of current or ex partner: Not on file    Emotionally abused: Not on file    Physically abused: Not on file    Forced sexual activity: Not on file  Other Topics Concern  . Not on file  Social History Narrative   Lives with boyfriend   Caffeine- 2 cups coffee daily     PHYSICAL EXAM  GENERAL EXAM/CONSTITUTIONAL: Vitals:  Vitals:   05/31/18 1533  BP: (!) 142/78  Pulse: 74  Weight: 208 lb (94.3 kg)  Height: 5\' 7"  (1.702 m)     Body mass index is 32.58 kg/m. Wt Readings from Last 3 Encounters:  05/31/18 208 lb (94.3 kg)  05/18/18 207 lb (93.9 kg)  08/21/17 204 lb 9.6 oz (92.8 kg)     Patient is in no distress; well developed, nourished and groomed; neck is supple  CARDIOVASCULAR:  Examination of carotid arteries is normal; no carotid bruits  Regular rate and rhythm, no murmurs  Examination of peripheral vascular system by observation and palpation is normal  EYES:  Ophthalmoscopic exam of optic discs and posterior segments is normal; no papilledema or hemorrhages  Visual Acuity Screening   Right eye Left eye Both eyes  Without correction: 20/30 20/30   With correction:         MUSCULOSKELETAL:  Gait, strength, tone, movements noted in Neurologic exam below  NEUROLOGIC: MENTAL STATUS:  No flowsheet data found.  awake, alert, oriented to person, place and time  recent and remote memory intact  normal attention and concentration  language fluent, comprehension intact, naming intact  fund of knowledge appropriate  CRANIAL NERVE:   2nd - no papilledema on fundoscopic exam  2nd, 3rd, 4th, 6th - pupils equal and reactive to light, visual fields full to confrontation, extraocular  muscles intact, no nystagmus  5th - facial sensation symmetric  7th - facial strength symmetric  8th - hearing intact  9th - palate elevates symmetrically, uvula midline  11th - shoulder shrug symmetric  12th - tongue protrusion midline  MOTOR:   normal bulk and tone, full strength in the BUE, BLE  SENSORY:   normal and symmetric to light touch, temperature, vibration  COORDINATION:   finger-nose-finger, fine finger movements normal  REFLEXES:   deep tendon reflexes present and symmetric  GAIT/STATION:   narrow based gait; able to walk tandem; romberg is negative     DIAGNOSTIC DATA (LABS, IMAGING, TESTING) - I reviewed patient records, labs, notes, testing and imaging myself where available.  Lab Results  Component Value Date   WBC 8.5 02/22/2018   HGB 15.4 (H) 02/22/2018   HCT 45.8 02/22/2018   MCV 85.3 02/22/2018   PLT 312 02/22/2018      Component Value Date/Time   NA 139 02/22/2018 1451   NA 139 01/14/2017 1536   K 4.0 02/22/2018 1451   CL 102 02/22/2018 1451   CO2 27 02/22/2018 1451   GLUCOSE 167 (H) 02/22/2018 1451   BUN 12 02/22/2018 1451   BUN 13 01/14/2017 1536   CREATININE 1.12 (H) 02/22/2018 1451   CALCIUM 9.8 02/22/2018 1451   PROT 6.7 01/14/2017 1536   ALBUMIN 4.1 01/14/2017 1536   AST 17 01/14/2017 1536   ALT 14 01/14/2017 1536   ALKPHOS 93 01/14/2017 1536   BILITOT 0.2 01/14/2017 1536   GFRNONAA 57 (L)  02/22/2018 1451   GFRAA >60 02/22/2018 1451   Lab Results  Component Value Date   CHOL 213 (H) 01/14/2017   HDL 42 01/14/2017   LDLCALC 116 (H) 01/14/2017   TRIG 274 (H) 01/14/2017   CHOLHDL 5.1 (H) 01/14/2017   Lab Results  Component Value Date   HGBA1C 6.1 (H) 01/14/2017   No results found for: VITAMINB12 Lab Results  Component Value Date   TSH 1.730 01/14/2017    02/28/14 MRI brain [I reviewed images myself and agree with interpretation. -VRP]  1. Normal MRI of the brain. 2. No acute or focal lesion to explain the patient's vertigo or tinnitus. 3. Chronic left maxillary sinus disease.  02/22/18 CT head [I reviewed images myself and agree with interpretation. -VRP]  - normal    ASSESSMENT AND PLAN  47 y.o. year old female here with:  Dx: migraine variant vs other CNS causes (vascular or inflamm or demyelinating)  1. Left arm numbness   2. Left leg numbness   3. Migraine with aura and without status migrainosus, not intractable     PLAN:   - check MRI brain - increase amitriptyline to 25mg  at bedtime - continue sumatriptan (will rx 50mg  strength, as patient was using lower doses)  Orders Placed This Encounter  Procedures  . MR BRAIN W WO CONTRAST   Meds ordered this encounter  Medications  . amitriptyline (ELAVIL) 25 MG tablet    Sig: Take 1 tablet (25 mg total) by mouth at bedtime.    Dispense:  30 tablet    Refill:  12  . SUMAtriptan (IMITREX) 50 MG tablet    Sig: May repeat in 2 hours as needed. Max 2 tabs per day. Max 8 tabs per month.    Dispense:  8 tablet    Refill:  6   Return in about 6 months (around 12/01/2018).    Penni Bombard, MD 3/53/6144, 3:15 PM Certified in  Neurology, Neurophysiology and Neuroimaging  [facility] [address], [place], [place] [address] [phone]

## 2018-05-31 NOTE — Patient Instructions (Signed)
-   check MRI brain  - increase amitriptyline to 25mg  at bedtime  - continue sumatriptan as needed

## 2018-06-06 ENCOUNTER — Telehealth: Payer: Self-pay | Admitting: Diagnostic Neuroimaging

## 2018-06-06 MED ORDER — ALPRAZOLAM 0.5 MG PO TABS: 0.5000 mg | ORAL_TABLET | ORAL | 0 refills | Status: DC | PRN

## 2018-06-06 NOTE — Addendum Note (Signed)
Addended by: Andrey Spearman R on: 06/06/2018 05:20 PM   Modules accepted: Orders

## 2018-06-06 NOTE — Telephone Encounter (Signed)
Xanax for MRI sent in. -VRP

## 2018-06-06 NOTE — Telephone Encounter (Signed)
MR Brain w/wo contrast Dr. Su Ley Auth: [id number] (exp. 06/06/18 to 07/05/18). Patient is scheduled for 06/21/18 at [facility].   She informed me she is claustrophobic and would like to take something to help her.

## 2018-06-07 IMAGING — MG 2D DIGITAL SCREENING BILATERAL MAMMOGRAM WITH 3D TOMO WITH CAD
8 of 12 series · 8 of 28 positions shown · non-contrast
Comparison: Previous exam(s).

CLINICAL DATA: Screening.

EXAM:
2D DIGITAL SCREENING BILATERAL MAMMOGRAM WITH 3D TOMO WITH CAD

[L CC]
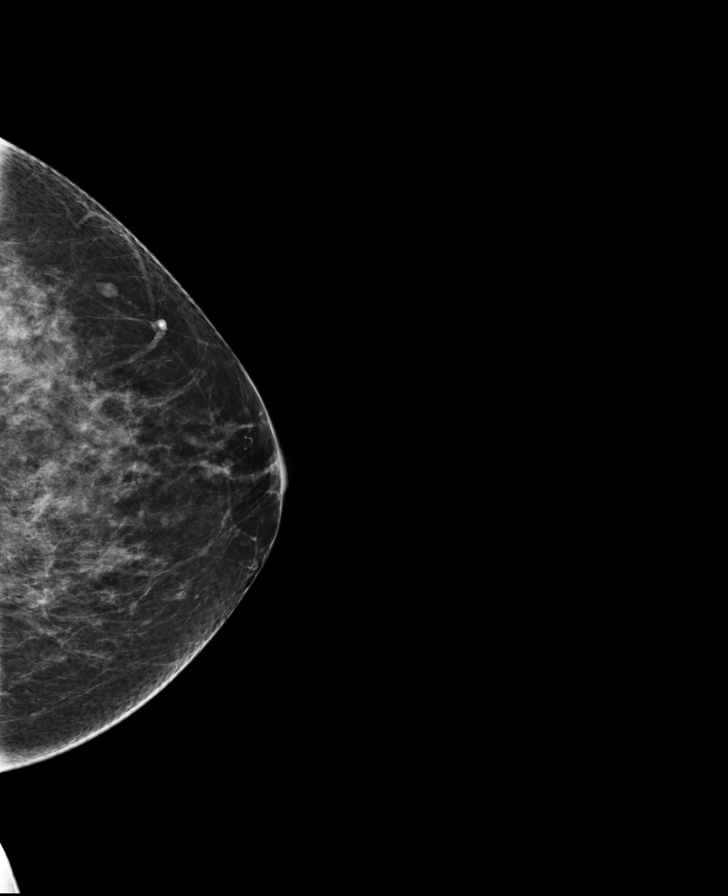

[R MLO]
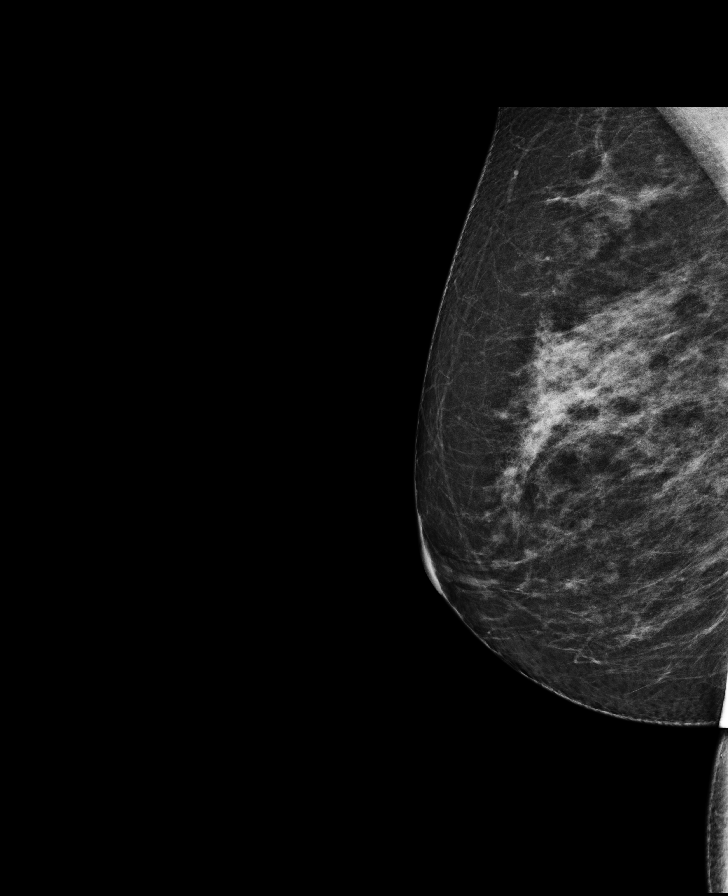

[R CC synth-2D]
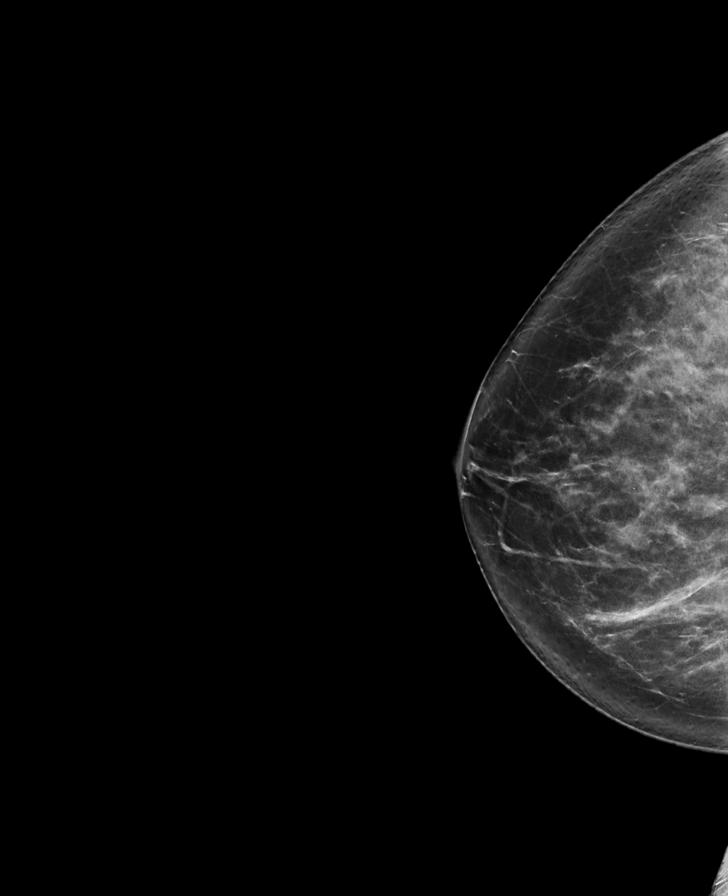

[R CC]
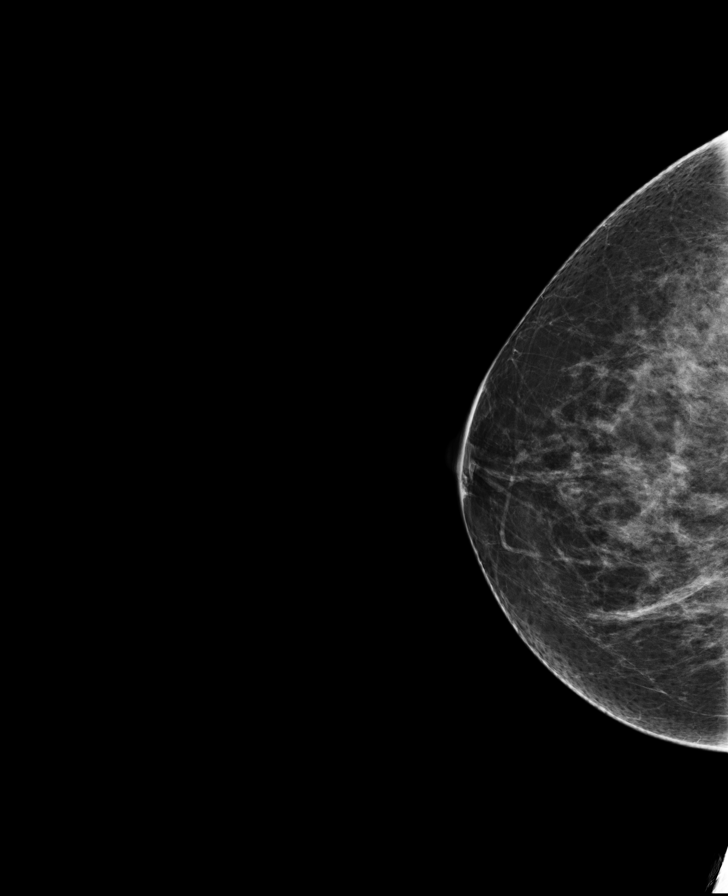

[L MLO synth-2D]
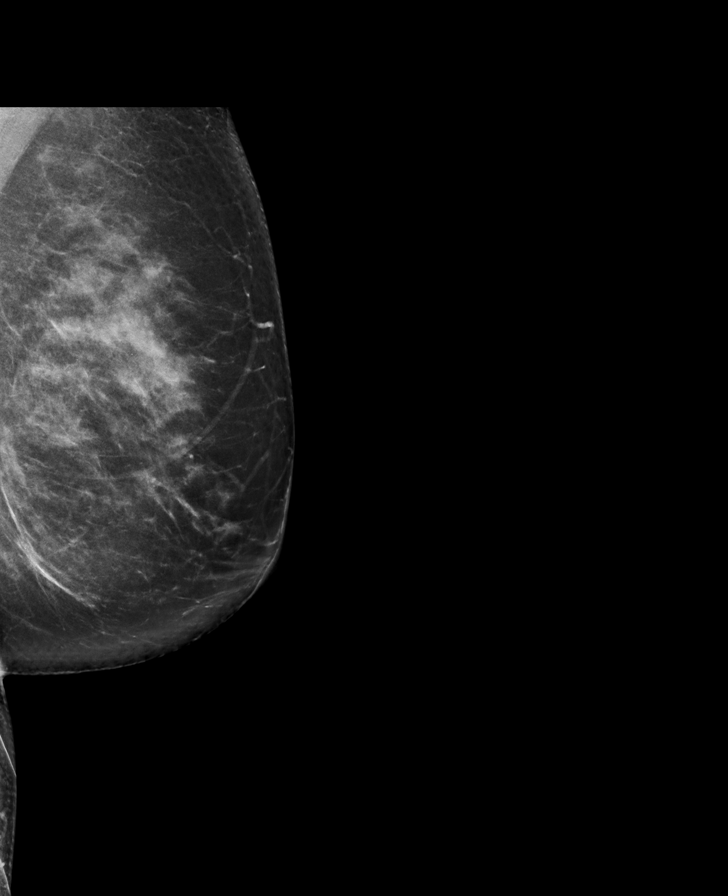

[L CC synth-2D]
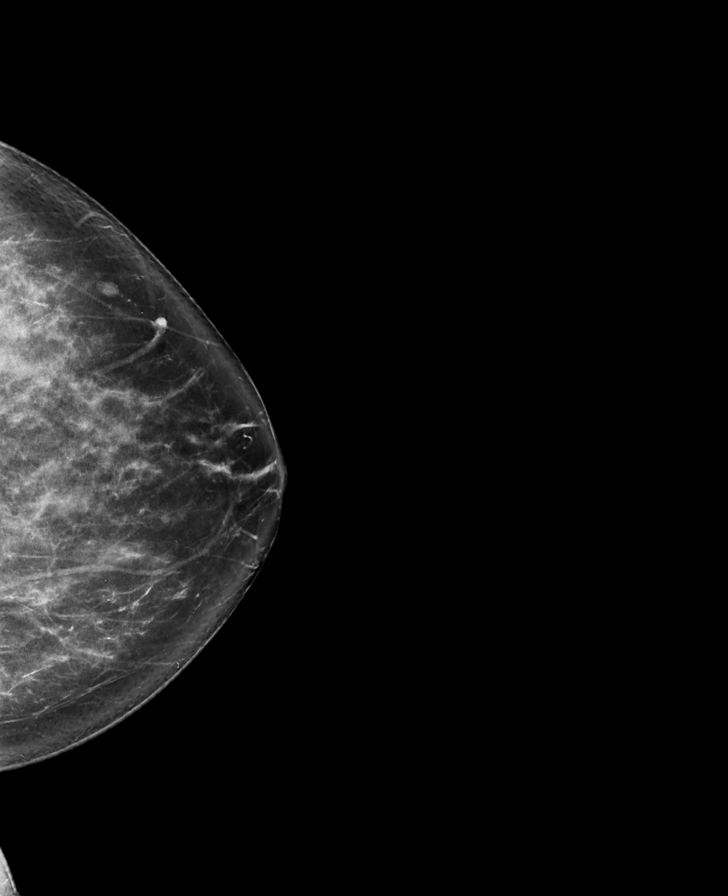

[R MLO synth-2D]
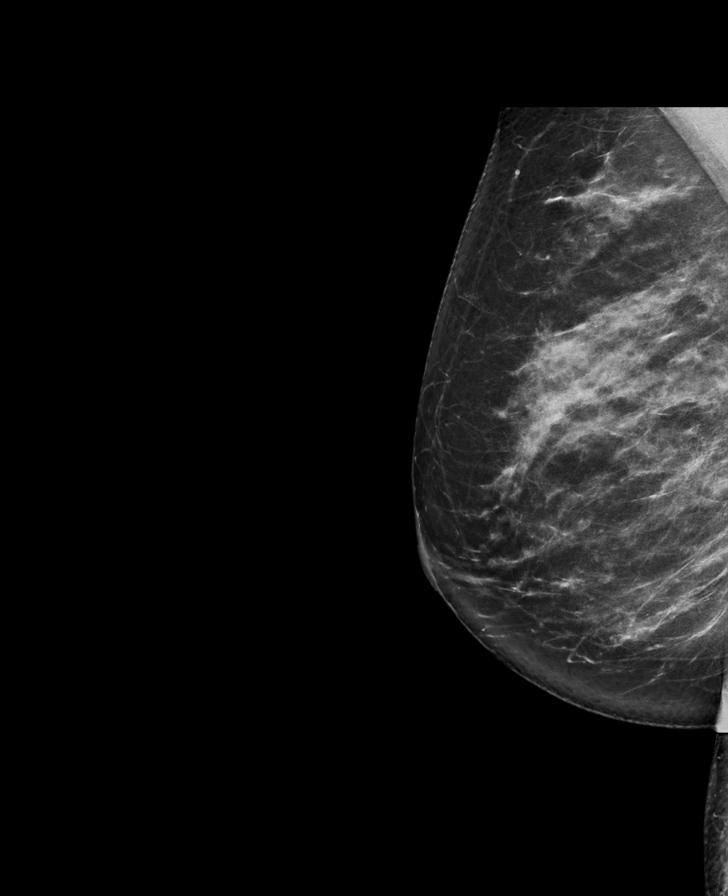

[L MLO]
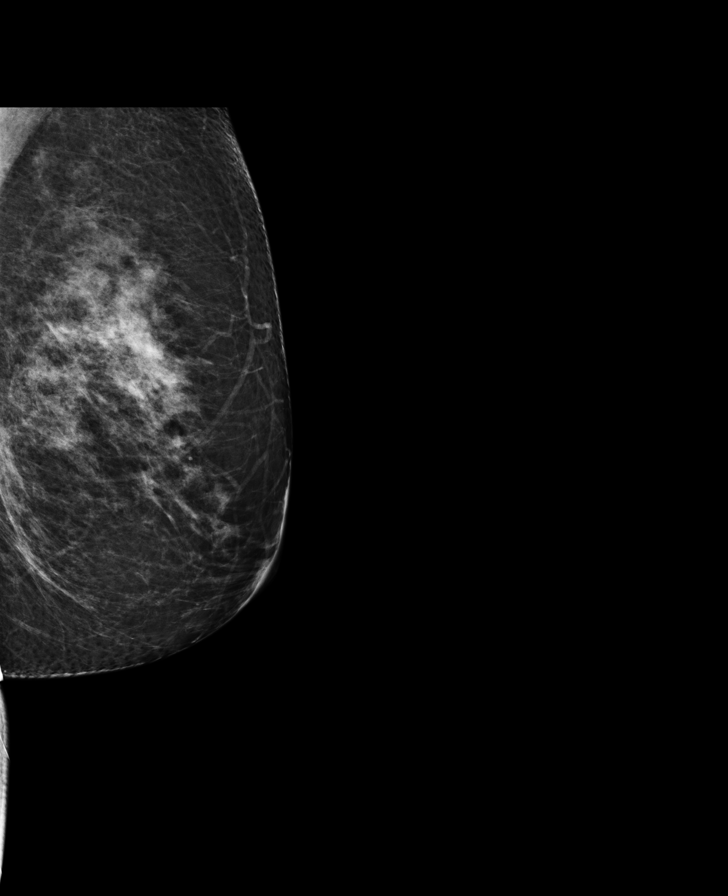

[8 of 28 positions shown; findings below may reference images not displayed]

ACR Breast Density Category c: The breast tissue is heterogeneously
dense, which may obscure small masses.
FINDINGS: There are no findings suspicious for malignancy. Images were
processed with CAD.
IMPRESSION: No mammographic evidence of malignancy. A result letter of this
screening mammogram will be mailed directly to the patient.

RECOMMENDATION:
Screening mammogram in one year. (Code:UA-9-KQN)

BI-RADS CATEGORY  1: Negative.

## 2018-06-09 ENCOUNTER — Telehealth: Payer: Self-pay

## 2018-06-09 NOTE — Telephone Encounter (Signed)
Refill for xanax submitted via fax to [facility] on [address].  MB RN.

## 2018-06-21 ENCOUNTER — Ambulatory Visit: Payer: Commercial Managed Care - PPO

## 2018-06-21 DIAGNOSIS — R200 Anesthesia of skin: Secondary | ICD-10-CM | POA: Diagnosis not present

## 2018-06-21 MED ORDER — GADOBENATE DIMEGLUMINE 529 MG/ML IV SOLN
20.0000 mL | Freq: Once | INTRAVENOUS | Status: AC | PRN
  Administered 2018-06-21: 20 mL via INTRAVENOUS

## 2018-06-27 ENCOUNTER — Telehealth: Payer: Self-pay

## 2018-06-27 NOTE — Telephone Encounter (Signed)
-----   Message from Penni Bombard, MD sent at 06/23/2018  4:39 PM EDT ----- Unremarkable imaging results. Please call patient. Continue current plan. -VRP

## 2018-06-27 NOTE — Telephone Encounter (Signed)
Spoke with the patient and she verbalized understanding her results. No questions at this time.

## 2018-09-19 ENCOUNTER — Other Ambulatory Visit: Payer: Self-pay | Admitting: Obstetrics & Gynecology

## 2018-09-19 DIAGNOSIS — Z1231 Encounter for screening mammogram for malignant neoplasm of breast: Secondary | ICD-10-CM

## 2018-09-22 DIAGNOSIS — L720 Epidermal cyst: Secondary | ICD-10-CM | POA: Diagnosis not present

## 2018-09-26 ENCOUNTER — Encounter: Payer: Self-pay | Admitting: Podiatry

## 2018-09-26 ENCOUNTER — Ambulatory Visit: Payer: Commercial Managed Care - PPO | Admitting: Podiatry

## 2018-09-26 DIAGNOSIS — M216X9 Other acquired deformities of unspecified foot: Secondary | ICD-10-CM

## 2018-09-26 DIAGNOSIS — M79671 Pain in right foot: Secondary | ICD-10-CM

## 2018-09-26 DIAGNOSIS — M2042 Other hammer toe(s) (acquired), left foot: Secondary | ICD-10-CM | POA: Diagnosis not present

## 2018-09-26 DIAGNOSIS — L84 Corns and callosities: Secondary | ICD-10-CM

## 2018-09-26 DIAGNOSIS — Q828 Other specified congenital malformations of skin: Secondary | ICD-10-CM

## 2018-09-26 DIAGNOSIS — M2041 Other hammer toe(s) (acquired), right foot: Secondary | ICD-10-CM

## 2018-09-26 NOTE — Patient Instructions (Signed)

## 2018-09-26 NOTE — Progress Notes (Signed)
Subjective: 47 year old female presents the office today for concerns of calluses to the balls of both of her feet with the left side worse than the right.  She states that some of it did come up in the left side.  She denies any recent injury or trauma.  She has some occasional discomfort with walking.  Since I last saw her the pain in the ball of her foot that was seen her before did resolve.  She also has some plantar fasciitis issues but this improved with over-the-counter inserts.  She has no other concerns today.  No recent injury.  She previously had them cut out previously.  Denies any systemic complaints such as fevers, chills, nausea, vomiting. No acute changes since last appointment, and no other complaints at this time.   Objective: AAO x3, NAD DP/PT pulses palpable bilaterally, CRT less than 3 seconds Semirigid hammertoe contracture present bilateral second digits.  Prominent metatarsal head plantarly there is a hyperkeratotic lesion submetatarsal to the left side worse than the right.  There is no edema, erythema there is no underlying ulceration drainage or any signs of infection. No pain on the course or insertion of the plantar fascia.  Achilles tendon intact.  No edema bilaterally. No open lesions or pre-ulcerative lesions.  No pain with calf compression, swelling, warmth, erythema  Assessment: Hyperkeratotic lesions  Plan: -All treatment options discussed with the patient including all alternatives, risks, complications.  -Hyperkeratotic lesions were sharply debrided x2 without any complications or bleeding.  I dispensed metatarsal offloading pads for her.  Recommend moisturizer daily. -Discussed general stretching exercises for plantar fasciitis.  She is currently without any symptoms this area. -Patient encouraged to call the office with any questions, concerns, change in symptoms.   -RTC prn  Trula Slade DPM

## 2018-11-07 ENCOUNTER — Ambulatory Visit
Admission: RE | Admit: 2018-11-07 | Discharge: 2018-11-07 | Disposition: A | Discharge status: Home / Self Care | Payer: Commercial Managed Care - PPO | Source: Ambulatory Visit | Attending: Obstetrics & Gynecology | Admitting: Obstetrics & Gynecology

## 2018-11-07 DIAGNOSIS — Z1231 Encounter for screening mammogram for malignant neoplasm of breast: Secondary | ICD-10-CM

## 2018-12-12 ENCOUNTER — Ambulatory Visit (INDEPENDENT_AMBULATORY_CARE_PROVIDER_SITE_OTHER): Payer: Commercial Managed Care - PPO | Admitting: Diagnostic Neuroimaging

## 2018-12-12 ENCOUNTER — Encounter: Payer: Self-pay | Admitting: Diagnostic Neuroimaging

## 2018-12-12 VITALS — BP 134/74 | HR 77 | Ht 67.0 in | Wt 211.0 lb

## 2018-12-12 DIAGNOSIS — G43109 Migraine with aura, not intractable, without status migrainosus: Secondary | ICD-10-CM

## 2018-12-12 MED ORDER — SUMATRIPTAN SUCCINATE 50 MG PO TABS: ORAL_TABLET | ORAL | 6 refills | Status: DC

## 2018-12-12 MED ORDER — AMITRIPTYLINE HCL 25 MG PO TABS: 25.0000 mg | ORAL_TABLET | Freq: Every day | ORAL | 12 refills | Status: DC

## 2018-12-12 NOTE — Progress Notes (Signed)
[facility]  PATIENT: Crystal Harris DOB: [birth year]  REFERRING CLINICIAN: ER  HISTORY FROM: patient  REASON FOR VISIT: follow up   HISTORICAL  CHIEF COMPLAINT:  Chief Complaint  Patient presents with  . Migraine    rm 7, "my headaches are better, no migraines in past 6 months"  . Numbness  . Follow-up    6 month    HISTORY OF PRESENT ILLNESS:   UPDATE (12/12/18, VRP): Since last visit, doing well. HA are improved on amitrip. 1 HA every few months. Weather, skipping meals and stress can affect HA. No other alleviating or aggravating factors. Tolerating meds.    PRIOR HPI (05/30/18): 48 year old female here for evaluation of dizziness, vertigo, headaches.  Patient has history of migraine headaches, on the top of her head, throbbing sensation associate with nausea, photophobia, phonophobia.  She has about 6 of these per month which are mild and one severe one every 3 to 4 months.  She uses amitriptyline and sumatriptan with fairly good results.  Patient also having an episode of left arm and leg weakness lasting for 1 to 2 days in May 2019.  This was associated with headache.  Patient went to the emergency room had CT scan head which is unremarkable.  Patient was referred to neurology clinic for further evaluation.    REVIEW OF SYSTEMS: Full 14 system review of systems performed and negative with exception of: memory loss speech diff ringing ears back pain cough.    ALLERGIES: No Known Allergies  HOME MEDICATIONS: Outpatient Medications Prior to Visit  Medication Sig Dispense Refill  . amitriptyline (ELAVIL) 25 MG tablet Take 1 tablet (25 mg total) by mouth at bedtime. 30 tablet 12  . cetirizine (ZYRTEC) 10 MG chewable tablet Chew 10 mg by mouth daily.    . SUMAtriptan (IMITREX) 50 MG tablet May repeat in 2 hours as needed. Max 2 tabs per day. Max 8 tabs per month. 8 tablet 6   No facility-administered medications prior to visit.     PAST MEDICAL  HISTORY: Past Medical History:  Diagnosis Date  . Allergy   . Anxiety    Claustophobic - no meds0  . Arthritis    lower back, rupture disk L5, bulging disk L4, no meds  . Bronchitis   . Chest congestion   . GERD (gastroesophageal reflux disease)    occasional, no meds, diet controlled  . Headache    Migraines  . Heart murmur    mild during pregnancy, never caused any problems  . Hypercholesterolemia   . Pneumonia   . Shortness of breath dyspnea   . Sinus congestion     PAST SURGICAL HISTORY: Past Surgical History:  Procedure Laterality Date  . ABDOMINAL HYSTERECTOMY N/A 08/11/2016   Procedure: HYSTERECTOMY ABDOMINAL;  Surgeon: Terrance Mass, MD;  Location: [facility];  Service: Gynecology;  Laterality: N/A;  . BARTHOLIN CYST MARSUPIALIZATION    . BILATERAL SALPINGECTOMY Bilateral 08/11/2016   Procedure: BILATERAL SALPINGECTOMY;  Surgeon: Terrance Mass, MD;  Location: [facility];  Service: Gynecology;  Laterality: Bilateral;  . CESAREAN SECTION     x 1  . WISDOM TOOTH EXTRACTION      FAMILY HISTORY: Family History  Problem Relation Age of Onset  . Diabetes Mother   . Hyperlipidemia Mother   . Emphysema Mother   . Cancer Father        brain  . Hypertension Father   . Cancer Sister  lung to brain  . Hypertension Sister   . Hyperlipidemia Brother   . Breast cancer Paternal Grandmother   . Blindness Paternal Grandmother   . Emphysema Maternal Grandmother   . Stroke Maternal Grandfather     SOCIAL HISTORY: Social History   Socioeconomic History  . Marital status: Divorced    Spouse name: Not on file  . Number of children: 2  . Years of education: comm college  . Highest education level: Not on file  Occupational History    Comment: [employer]  . Financial resource strain: Not on file  . Food insecurity:    Worry: Not on file    Inability: Not on file  . Transportation needs:    Medical: Not on file    Non-medical: Not on file   Tobacco Use  . Smoking status: Never Smoker  . Smokeless tobacco: Never Used  Substance and Sexual Activity  . Alcohol use: Yes    Alcohol/week: 0.0 standard drinks    Comment: minimal  . Drug use: No    Comment: none since early 20's  . Sexual activity: Yes    Partners: Male    Birth control/protection: None    Comment: 1st intercourse- 39, partners- 11, current partner- 13 yrs   Lifestyle  . Physical activity:    Days per week: Not on file    Minutes per session: Not on file  . Stress: Not on file  Relationships  . Social connections:    Talks on phone: Not on file    Gets together: Not on file    Attends religious service: Not on file    Active member of club or organization: Not on file    Attends meetings of clubs or organizations: Not on file    Relationship status: Not on file  . Intimate partner violence:    Fear of current or ex partner: Not on file    Emotionally abused: Not on file    Physically abused: Not on file    Forced sexual activity: Not on file  Other Topics Concern  . Not on file  Social History Narrative   Lives with boyfriend   Caffeine- 2 cups coffee daily     PHYSICAL EXAM  GENERAL EXAM/CONSTITUTIONAL: Vitals:  Vitals:   12/12/18 1604  BP: 134/74  Pulse: 77  Weight: 211 lb (95.7 kg)  Height: 5\' 7"  (1.702 m)   Body mass index is 33.05 kg/m. Wt Readings from Last 3 Encounters:  12/12/18 211 lb (95.7 kg)  06/21/18 208 lb (94.3 kg)  05/31/18 208 lb (94.3 kg)    Patient is in no distress; well developed, nourished and groomed; neck is supple  CARDIOVASCULAR:  Examination of carotid arteries is normal; no carotid bruits  Regular rate and rhythm, no murmurs  Examination of peripheral vascular system by observation and palpation is normal  EYES:  Ophthalmoscopic exam of optic discs and posterior segments is normal; no papilledema or hemorrhages No exam data present  MUSCULOSKELETAL:  Gait, strength, tone, movements noted in  Neurologic exam below  NEUROLOGIC: MENTAL STATUS:  No flowsheet data found.  awake, alert, oriented to person, place and time  recent and remote memory intact  normal attention and concentration  language fluent, comprehension intact, naming intact  fund of knowledge appropriate  CRANIAL NERVE:   2nd - no papilledema on fundoscopic exam  2nd, 3rd, 4th, 6th - pupils equal and reactive to light, visual fields full to confrontation, extraocular  muscles intact, no nystagmus  5th - facial sensation symmetric  7th - facial strength symmetric  8th - hearing intact  9th - palate elevates symmetrically, uvula midline  11th - shoulder shrug symmetric  12th - tongue protrusion midline  MOTOR:   normal bulk and tone, full strength in the BUE, BLE  SENSORY:   normal and symmetric to light touch, temperature, vibration  COORDINATION:   finger-nose-finger, fine finger movements normal  REFLEXES:   deep tendon reflexes present and symmetric  GAIT/STATION:   narrow based gait     DIAGNOSTIC DATA (LABS, IMAGING, TESTING) - I reviewed patient records, labs, notes, testing and imaging myself where available.  Lab Results  Component Value Date   WBC 8.5 02/22/2018   HGB 15.4 (H) 02/22/2018   HCT 45.8 02/22/2018   MCV 85.3 02/22/2018   PLT 312 02/22/2018      Component Value Date/Time   NA 139 02/22/2018 1451   NA 139 01/14/2017 1536   K 4.0 02/22/2018 1451   CL 102 02/22/2018 1451   CO2 27 02/22/2018 1451   GLUCOSE 167 (H) 02/22/2018 1451   BUN 12 02/22/2018 1451   BUN 13 01/14/2017 1536   CREATININE 1.12 (H) 02/22/2018 1451   CALCIUM 9.8 02/22/2018 1451   PROT 6.7 01/14/2017 1536   ALBUMIN 4.1 01/14/2017 1536   AST 17 01/14/2017 1536   ALT 14 01/14/2017 1536   ALKPHOS 93 01/14/2017 1536   BILITOT 0.2 01/14/2017 1536   GFRNONAA 57 (L) 02/22/2018 1451   GFRAA >60 02/22/2018 1451   Lab Results  Component Value Date   CHOL 213 (H) 01/14/2017   HDL  42 01/14/2017   LDLCALC 116 (H) 01/14/2017   TRIG 274 (H) 01/14/2017   CHOLHDL 5.1 (H) 01/14/2017   Lab Results  Component Value Date   HGBA1C 6.1 (H) 01/14/2017   No results found for: VITAMINB12 Lab Results  Component Value Date   TSH 1.730 01/14/2017    02/28/14 MRI brain [I reviewed images myself and agree with interpretation. -VRP]  1. Normal MRI of the brain. 2. No acute or focal lesion to explain the patient's vertigo or tinnitus. 3. Chronic left maxillary sinus disease.  02/22/18 CT head [I reviewed images myself and agree with interpretation. -VRP]  - normal   06/21/18 MRI brain [I reviewed images myself and agree with interpretation. -VRP]   - normal     ASSESSMENT AND PLAN  48 y.o. year old female here with:  Dx:   1. Migraine with aura and without status migrainosus, not intractable      PLAN:   MIGRAINE WITH AURA - continue amitriptyline to 25mg  at bedtime - continue sumatriptan (will rx 50mg  strength, as patient is doing well on this lower dose)  Meds ordered this encounter  Medications  . SUMAtriptan (IMITREX) 50 MG tablet    Sig: May repeat in 2 hours as needed. Max 2 tabs per day. Max 8 tabs per month.    Dispense:  8 tablet    Refill:  6  . amitriptyline (ELAVIL) 25 MG tablet    Sig: Take 1 tablet (25 mg total) by mouth at bedtime.    Dispense:  30 tablet    Refill:  12   Return in about 1 year (around 12/13/2019) for with NP (Amy Lomax).    Penni Bombard, MD 9/38/1017, 5:10 PM Certified in Neurology, Neurophysiology and Neuroimaging  [facility] [address], [place], [place] [address] [phone]  [phone]  

## 2019-01-03 ENCOUNTER — Ambulatory Visit: Payer: Commercial Managed Care - PPO | Admitting: Family Medicine

## 2019-01-10 ENCOUNTER — Telehealth: Payer: Self-pay | Admitting: Family Medicine

## 2019-01-10 NOTE — Telephone Encounter (Signed)
Left VM in regards to an appt with Dr. Carlota Raspberry on 01/03/2019 that was no showed. We are currently offering telemed visits at this time if patient is interested.

## 2019-05-10 ENCOUNTER — Encounter: Payer: Self-pay | Admitting: Family Medicine

## 2019-05-10 ENCOUNTER — Telehealth (INDEPENDENT_AMBULATORY_CARE_PROVIDER_SITE_OTHER): Payer: Commercial Managed Care - PPO | Admitting: Family Medicine

## 2019-05-10 ENCOUNTER — Telehealth: Payer: Self-pay

## 2019-05-10 ENCOUNTER — Other Ambulatory Visit: Payer: Self-pay | Admitting: Obstetrics & Gynecology

## 2019-05-10 VITALS — BP 119/78 | Ht 67.0 in | Wt 200.0 lb

## 2019-05-10 DIAGNOSIS — Z20828 Contact with and (suspected) exposure to other viral communicable diseases: Secondary | ICD-10-CM

## 2019-05-10 DIAGNOSIS — Z20822 Contact with and (suspected) exposure to covid-19: Secondary | ICD-10-CM

## 2019-05-10 DIAGNOSIS — R059 Cough, unspecified: Secondary | ICD-10-CM

## 2019-05-10 DIAGNOSIS — R05 Cough: Secondary | ICD-10-CM

## 2019-05-10 NOTE — Telephone Encounter (Signed)
Crystal Harris, CMA  

## 2019-05-10 NOTE — Progress Notes (Signed)
CC: Patient is asking to be tested for  COVID 19 due to exposure from boyfriends parents whom both were confirmed COVID

## 2019-05-10 NOTE — Progress Notes (Signed)
Telemedicine Encounter- SOAP NOTE Established Patient  I discussed the limitations, risks, security and privacy concerns of performing an evaluation and management service by telephone and the availability of in person appointments. I also discussed with the patient that there may be a patient responsible charge related to this service. The patient expressed understanding and agreed to proceed.  This telephone encounter was conducted with the patient's verbal consent via audio telecommunications: yes Patient was instructed to have this encounter in a suitably private space; and to only have persons present to whom they give permission to participate. In addition, patient identity was confirmed by use of name plus two identifiers (DOB and address).  I spent a total of 48min talking with the patient   COVID exposure  Subjective   Crystal Harris is a 48 y.o. female established patient. Telephone visit today for COVID exposure  HPI Patients boy friend-(live together) parents have COVID-father hospitalized and mother at home-pt and boy friend have tried to assist with deliveries and have been in the home-+exposure  Patient Active Problem List   Diagnosis Date Noted  . Mixed hyperlipidemia 05/14/2017  . Hyperglycemia 05/14/2017    Past Medical History:  Diagnosis Date  . Allergy   . Anxiety    Claustophobic - no meds0  . Arthritis    lower back, rupture disk L5, bulging disk L4, no meds  . Bronchitis   . Chest congestion   . GERD (gastroesophageal reflux disease)    occasional, no meds, diet controlled  . Headache    Migraines  . Heart murmur    mild during pregnancy, never caused any problems  . Hypercholesterolemia   . Pneumonia   . Shortness of breath dyspnea   . Sinus congestion     Current Outpatient Medications  Medication Sig Dispense Refill  . amitriptyline (ELAVIL) 25 MG tablet Take 1 tablet (25 mg total) by mouth at bedtime. 30 tablet 12  . cetirizine  (ZYRTEC) 10 MG chewable tablet Chew 10 mg by mouth daily.    . SUMAtriptan (IMITREX) 50 MG tablet May repeat in 2 hours as needed. Max 2 tabs per day. Max 8 tabs per month. 8 tablet 6   No current facility-administered medications for this visit.     No Known Allergies  Social History   Socioeconomic History  . Marital status: Divorced    Spouse name: Not on file  . Number of children: 2  . Years of education: comm college  . Highest education level: Not on file  Occupational History    Comment: [employer]  . Financial resource strain: Not on file  . Food insecurity    Worry: Not on file    Inability: Not on file  . Transportation needs    Medical: Not on file    Non-medical: Not on file  Tobacco Use  . Smoking status: Never Smoker  . Smokeless tobacco: Never Used  Substance and Sexual Activity  . Alcohol use: Yes    Alcohol/week: 0.0 standard drinks    Comment: minimal  . Drug use: No    Comment: none since early 20's  . Sexual activity: Yes    Partners: Male    Birth control/protection: None    Comment: 1st intercourse- 34, partners- 80, current partner- 13 yrs   Lifestyle  . Physical activity    Days per week: Not on file    Minutes per session: Not on file  . Stress: Not on  file  Relationships  . Social Herbalist on phone: Not on file    Gets together: Not on file    Attends religious service: Not on file    Active member of club or organization: Not on file    Attends meetings of clubs or organizations: Not on file    Relationship status: Not on file  . Intimate partner violence    Fear of current or ex partner: Not on file    Emotionally abused: Not on file    Physically abused: Not on file    Forced sexual activity: Not on file  Other Topics Concern  . Not on file  Social History Narrative   Lives with boyfriend   Caffeine- 2 cups coffee daily    Review of Systems  Constitutional: Positive for fever and  malaise/fatigue.  HENT: Positive for congestion and sore throat.   Respiratory: Positive for cough. Negative for sputum production.   Cardiovascular: Negative for chest pain.  Gastrointestinal: Positive for diarrhea.  Neurological: Positive for headaches.  no loss of taste or smell Hot flashes  Vitals as reported by the patient: Today's Vitals   05/10/19 1358  BP: 119/78  Weight: 200 lb (90.7 kg)  Height: 5\' 7"  (1.702 m)    1. Exposure to Covid-19 Virus Testing recommended-order placed  I discussed the assessment and treatment plan with the patient. The patient was provided an opportunity to ask questions and all were answered. The patient agreed with the plan and demonstrated an understanding of the instructions.   The patient was advised to call back or seek an in-person evaluation if the symptoms worsen or if the condition fails to improve as anticipated.  I provided 10 minutes of non-face-to-face time during this encounter.  LISA Hannah Beat, MD  Primary Care at [facility] 05-10-19

## 2019-05-11 DIAGNOSIS — Z20822 Contact with and (suspected) exposure to covid-19: Secondary | ICD-10-CM | POA: Insufficient documentation

## 2019-05-11 DIAGNOSIS — Z20828 Contact with and (suspected) exposure to other viral communicable diseases: Secondary | ICD-10-CM | POA: Insufficient documentation

## 2019-05-14 LAB — NOVEL CORONAVIRUS, NAA: SARS-CoV-2, NAA: NOT DETECTED

## 2019-05-16 NOTE — Telephone Encounter (Signed)
covid negative

## 2019-05-22 ENCOUNTER — Other Ambulatory Visit: Payer: Self-pay

## 2019-05-23 ENCOUNTER — Encounter: Payer: Commercial Managed Care - PPO | Admitting: Obstetrics & Gynecology

## 2019-05-23 ENCOUNTER — Encounter: Payer: Self-pay | Admitting: Obstetrics & Gynecology

## 2019-05-23 ENCOUNTER — Ambulatory Visit (INDEPENDENT_AMBULATORY_CARE_PROVIDER_SITE_OTHER): Payer: Commercial Managed Care - PPO | Admitting: Obstetrics & Gynecology

## 2019-05-23 VITALS — BP 136/80 | Ht 66.0 in | Wt 214.0 lb

## 2019-05-23 DIAGNOSIS — Z90710 Acquired absence of both cervix and uterus: Secondary | ICD-10-CM | POA: Diagnosis not present

## 2019-05-23 DIAGNOSIS — N951 Menopausal and female climacteric states: Secondary | ICD-10-CM | POA: Diagnosis not present

## 2019-05-23 DIAGNOSIS — E6609 Other obesity due to excess calories: Secondary | ICD-10-CM | POA: Diagnosis not present

## 2019-05-23 DIAGNOSIS — Z6834 Body mass index (BMI) 34.0-34.9, adult: Secondary | ICD-10-CM

## 2019-05-23 DIAGNOSIS — Z01419 Encounter for gynecological examination (general) (routine) without abnormal findings: Secondary | ICD-10-CM | POA: Diagnosis not present

## 2019-05-23 NOTE — Progress Notes (Signed)
Crystal Harris 1971-08-20 [id number]   History:    48 y.o. G3P2A1L2 Boyfriend x 14 years.  Sons 24+ and 14 yo.  RP:  Established patient presenting for annual gyn exam   HPI: S/P TAH/Bilateral salpingectomy for Fibroids in 09/2016.  No vasomotor menopausal Sx.  No pelvic pain.  No pain with IC.  Urine/BMs wnl.  Breasts wnl.  BMI 34.54.  Needs to improve fitness and nutrition.  We will follow-up here fasting for health labs.  Past medical history,surgical history, family history and social history were all reviewed and documented in the EPIC chart.  Gynecologic History Patient's last menstrual period was 08/02/2016. Contraception: status post hysterectomy Last Pap: 05/2016. Results were: Negative Last mammogram: 10/2018. Results were: Negative Bone Density: Never Colonoscopy: Never  Obstetric History OB History  Gravida Para Term Preterm AB Living  3 2     1 2   SAB TAB Ectopic Multiple Live Births               # Outcome Date GA Lbr Len/2nd Weight Sex Delivery Anes PTL Lv  3 AB           2 Para           1 Para              ROS: A ROS was performed and pertinent positives and negatives are included in the history.  GENERAL: No fevers or chills. HEENT: No change in vision, no earache, sore throat or sinus congestion. NECK: No pain or stiffness. CARDIOVASCULAR: No chest pain or pressure. No palpitations. PULMONARY: No shortness of breath, cough or wheeze. GASTROINTESTINAL: No abdominal pain, nausea, vomiting or diarrhea, melena or bright red blood per rectum. GENITOURINARY: No urinary frequency, urgency, hesitancy or dysuria. MUSCULOSKELETAL: No joint or muscle pain, no back pain, no recent trauma. DERMATOLOGIC: No rash, no itching, no lesions. ENDOCRINE: No polyuria, polydipsia, no heat or cold intolerance. No recent change in weight. HEMATOLOGICAL: No anemia or easy bruising or bleeding. NEUROLOGIC: No headache, seizures, numbness, tingling or weakness. PSYCHIATRIC: No depression,  no loss of interest in normal activity or change in sleep pattern.     Exam:   BP 136/80   Ht 5' 6"  (1.676 m)   Wt 214 lb (97.1 kg)   LMP 08/02/2016   BMI 34.54 kg/m   Body mass index is 34.54 kg/m.  General appearance : Well developed well nourished female. No acute distress HEENT: Eyes: no retinal hemorrhage or exudates,  Neck supple, trachea midline, no carotid bruits, no thyroidmegaly Lungs: Clear to auscultation, no rhonchi or wheezes, or rib retractions  Heart: Regular rate and rhythm, no murmurs or gallops Breast:Examined in sitting and supine position were symmetrical in appearance, no palpable masses or tenderness,  no skin retraction, no nipple inversion, no nipple discharge, no skin discoloration, no axillary or supraclavicular lymphadenopathy Abdomen: no palpable masses or tenderness, no rebound or guarding Extremities: no edema or skin discoloration or tenderness  Pelvic: Vulva: Normal             Vagina: No gross lesions or discharge.  Pap reflex done.  Cervix/Uterus absent  Adnexa  Without masses or tenderness  Anus: Normal   Assessment/Plan:  48 y.o. female for annual exam   1. Encounter for Papanicolaou smear of vagina as part of routine gynecological examination Gynecologic exam status post TAH.  Pap reflex done on the vaginal vault.  Breast exam normal.  Screening mammogram January 2020 was negative.  Will start screening colonoscopy at 37.  We will follow-up for fasting health labs. - CBC; Future - Comp Met (CMET); Future - Lipid panel; Future - TSH; Future - VITAMIN D 25 Hydroxy (Vit-D Deficiency, Fractures); Future  2. S/P TAH (total abdominal hysterectomy)  3. Perimenopause Probable perimenopause, will do [place] to verify status. - FSH; Future  4. Class 1 obesity due to excess calories without serious comorbidity with body mass index (BMI) of 34.0 to 34.9 in adult Recommend a lower calorie/carb diet such as [employer].  Aerobic physical  activities 5 times a week and weightlifting every 2 days.  [name] MD, 1:43 PM 05/23/2019

## 2019-05-24 LAB — PAP IG W/ RFLX HPV ASCU

## 2019-05-26 ENCOUNTER — Other Ambulatory Visit: Payer: Self-pay

## 2019-05-26 DIAGNOSIS — Z20822 Contact with and (suspected) exposure to covid-19: Secondary | ICD-10-CM

## 2019-05-28 LAB — NOVEL CORONAVIRUS, NAA: SARS-CoV-2, NAA: NOT DETECTED

## 2019-05-29 ENCOUNTER — Encounter: Payer: Self-pay | Admitting: Obstetrics & Gynecology

## 2019-05-29 NOTE — Patient Instructions (Signed)
1. Encounter for Papanicolaou smear of vagina as part of routine gynecological examination Gynecologic exam status post TAH.  Pap reflex done on the vaginal vault.  Breast exam normal.  Screening mammogram January 2020 was negative.  Will start screening colonoscopy at 11.  We will follow-up for fasting health labs. - CBC; Future - Comp Met (CMET); Future - Lipid panel; Future - TSH; Future - VITAMIN D 25 Hydroxy (Vit-D Deficiency, Fractures); Future  2. S/P TAH (total abdominal hysterectomy)  3. Perimenopause Probable perimenopause, will do [place] to verify status. - FSH; Future  4. Class 1 obesity due to excess calories without serious comorbidity with body mass index (BMI) of 34.0 to 34.9 in adult Recommend a lower calorie/carb diet such as [employer].  Aerobic physical activities 5 times a week and weightlifting every 2 days.  Crystal Harris, it was a pleasure seeing you today!  I will inform you of your results as soon as they are available.

## 2019-07-12 ENCOUNTER — Encounter: Payer: Self-pay | Admitting: Gynecology

## 2019-09-25 ENCOUNTER — Other Ambulatory Visit: Payer: Self-pay | Admitting: Obstetrics & Gynecology

## 2019-09-25 DIAGNOSIS — Z1231 Encounter for screening mammogram for malignant neoplasm of breast: Secondary | ICD-10-CM

## 2019-11-15 ENCOUNTER — Other Ambulatory Visit: Payer: Self-pay

## 2019-11-15 ENCOUNTER — Ambulatory Visit
Admission: RE | Admit: 2019-11-15 | Discharge: 2019-11-15 | Disposition: A | Discharge status: Home / Self Care | Payer: Managed Care, Other (non HMO) | Source: Ambulatory Visit | Attending: Obstetrics & Gynecology | Admitting: Obstetrics & Gynecology

## 2019-11-15 DIAGNOSIS — Z1231 Encounter for screening mammogram for malignant neoplasm of breast: Secondary | ICD-10-CM

## 2019-11-20 ENCOUNTER — Other Ambulatory Visit: Payer: Self-pay | Admitting: Obstetrics & Gynecology

## 2019-11-20 DIAGNOSIS — R928 Other abnormal and inconclusive findings on diagnostic imaging of breast: Secondary | ICD-10-CM

## 2019-11-22 ENCOUNTER — Ambulatory Visit
Admission: RE | Admit: 2019-11-22 | Discharge: 2019-11-22 | Disposition: A | Discharge status: Home / Self Care | Payer: Managed Care, Other (non HMO) | Source: Ambulatory Visit | Attending: Obstetrics & Gynecology | Admitting: Obstetrics & Gynecology

## 2019-11-22 ENCOUNTER — Other Ambulatory Visit: Payer: Self-pay | Admitting: Obstetrics & Gynecology

## 2019-11-22 ENCOUNTER — Other Ambulatory Visit: Payer: Self-pay

## 2019-11-22 DIAGNOSIS — R928 Other abnormal and inconclusive findings on diagnostic imaging of breast: Secondary | ICD-10-CM

## 2019-11-22 DIAGNOSIS — N6320 Unspecified lump in the left breast, unspecified quadrant: Secondary | ICD-10-CM

## 2019-11-23 ENCOUNTER — Ambulatory Visit
Admission: RE | Admit: 2019-11-23 | Discharge: 2019-11-23 | Disposition: A | Discharge status: Home / Self Care | Payer: Managed Care, Other (non HMO) | Source: Ambulatory Visit | Attending: Obstetrics & Gynecology | Admitting: Obstetrics & Gynecology

## 2019-11-23 ENCOUNTER — Other Ambulatory Visit: Payer: Self-pay

## 2019-11-23 DIAGNOSIS — N6320 Unspecified lump in the left breast, unspecified quadrant: Secondary | ICD-10-CM

## 2019-12-14 ENCOUNTER — Other Ambulatory Visit: Payer: Self-pay

## 2019-12-14 ENCOUNTER — Ambulatory Visit: Payer: Managed Care, Other (non HMO) | Admitting: Family Medicine

## 2019-12-14 ENCOUNTER — Encounter: Payer: Self-pay | Admitting: Family Medicine

## 2019-12-14 VITALS — BP 132/78 | HR 87 | Temp 97.7°F | Ht 66.0 in | Wt 214.0 lb

## 2019-12-14 DIAGNOSIS — G43109 Migraine with aura, not intractable, without status migrainosus: Secondary | ICD-10-CM | POA: Diagnosis not present

## 2019-12-14 MED ORDER — AMITRIPTYLINE HCL 25 MG PO TABS: ORAL_TABLET | ORAL | 12 refills | Status: DC

## 2019-12-14 MED ORDER — SUMATRIPTAN SUCCINATE 50 MG PO TABS: ORAL_TABLET | ORAL | 6 refills | Status: DC

## 2019-12-14 NOTE — Progress Notes (Signed)
PATIENT: Crystal Harris DOB: 02/24/1971  REASON FOR VISIT: follow up HISTORY FROM: patient  Chief Complaint  Patient presents with  . Follow-up    Yearly f/u. Alone. Rm 8. Patient mentioned that she has been having some headaches over the last couple weeks due to work and family stress. She stated that her headaches have not turned into migraines.    . Medication Refill    Amitriptyline and Sumatriptan     HISTORY OF PRESENT ILLNESS: Today 12/14/19 Breindel Harris is a 49 y.o. female here today for follow up for migraines. She reports that, overall, she is doing well. She does note increased stress with work and family that has contributed to more headaches. She also notes that allergy symptoms have played a role. She has 4-6 migraines a year. Eye exam in December, 2020.   HISTORY: (copied from Dr Gladstone Lighter note on 12/12/2018)  UPDATE (12/12/18, VRP): Since last visit, doing well. HA are improved on amitrip. 1 HA every few months. Weather, skipping meals and stress can affect HA. No other alleviating or aggravating factors. Tolerating meds.    PRIOR HPI (05/30/18): 49 year old female here for evaluation of dizziness, vertigo, headaches.  Patient has history of migraine headaches, on the top of her head, throbbing sensation associate with nausea, photophobia, phonophobia.  She has about 6 of these per month which are mild and one severe one every 3 to 4 months.  She uses amitriptyline and sumatriptan with fairly good results.  Patient also having an episode of left arm and leg weakness lasting for 1 to 2 days in May 2019.  This was associated with headache.  Patient went to the emergency room had CT scan head which is unremarkable.  Patient was referred to neurology clinic for further evaluation.   REVIEW OF SYSTEMS: Out of a complete 14 system review of symptoms, the patient complains only of the following symptoms, headaches and all other reviewed systems  are negative.   ALLERGIES: No Known Allergies  HOME MEDICATIONS: Outpatient Medications Prior to Visit  Medication Sig Dispense Refill  . cetirizine (ZYRTEC) 10 MG chewable tablet Chew 10 mg by mouth daily.    Marland Kitchen amitriptyline (ELAVIL) 25 MG tablet Take 1 tablet (25 mg total) by mouth at bedtime. 30 tablet 12  . SUMAtriptan (IMITREX) 50 MG tablet May repeat in 2 hours as needed. Max 2 tabs per day. Max 8 tabs per month. 8 tablet 6   No facility-administered medications prior to visit.    PAST MEDICAL HISTORY: Past Medical History:  Diagnosis Date  . Allergy   . Anxiety    Claustophobic - no meds0  . Arthritis    lower back, rupture disk L5, bulging disk L4, no meds  . Bronchitis   . Chest congestion   . GERD (gastroesophageal reflux disease)    occasional, no meds, diet controlled  . Headache    Migraines  . Heart murmur    mild during pregnancy, never caused any problems  . Hypercholesterolemia   . Pneumonia   . Shortness of breath dyspnea   . Sinus congestion     PAST SURGICAL HISTORY: Past Surgical History:  Procedure Laterality Date  . ABDOMINAL HYSTERECTOMY N/A 08/11/2016   Procedure: HYSTERECTOMY ABDOMINAL;  Surgeon: Terrance Mass, MD;  Location: [facility];  Service: Gynecology;  Laterality: N/A;  . BARTHOLIN CYST MARSUPIALIZATION    . BILATERAL SALPINGECTOMY Bilateral 08/11/2016   Procedure: BILATERAL SALPINGECTOMY;  Surgeon: Terrance Mass, MD;  Location: [facility];  Service: Gynecology;  Laterality: Bilateral;  . CESAREAN SECTION     x 1  . WISDOM TOOTH EXTRACTION      FAMILY HISTORY: Family History  Problem Relation Age of Onset  . Diabetes Mother   . Hyperlipidemia Mother   . Emphysema Mother   . Cancer Father        brain  . Hypertension Father   . Cancer Sister        lung to brain  . Hypertension Sister   . Hyperlipidemia Brother   . Breast cancer Paternal Grandmother   . Blindness Paternal Grandmother   . Emphysema Maternal Grandmother    . Stroke Maternal Grandfather     SOCIAL HISTORY: Social History   Socioeconomic History  . Marital status: Divorced    Spouse name: Not on file  . Number of children: 2  . Years of education: comm college  . Highest education level: Not on file  Occupational History    Comment: [employer]  Tobacco Use  . Smoking status: Never Smoker  . Smokeless tobacco: Never Used  Substance and Sexual Activity  . Alcohol use: Yes    Alcohol/week: 0.0 standard drinks    Comment: minimal  . Drug use: No    Comment: none since early 20's  . Sexual activity: Yes    Partners: Male    Birth control/protection: None    Comment: 1st intercourse- 33, partners- 64, current partner- 13 yrs   Other Topics Concern  . Not on file  Social History Narrative   Lives with boyfriend   Caffeine- 2 cups coffee daily   Social Determinants of Health   Financial Resource Strain:   . Difficulty of Paying Living Expenses: Not on file  Food Insecurity:   . Worried About Charity fundraiser in the Last Year: Not on file  . Ran Out of Food in the Last Year: Not on file  Transportation Needs:   . Lack of Transportation (Medical): Not on file  . Lack of Transportation (Non-Medical): Not on file  Physical Activity:   . Days of Exercise per Week: Not on file  . Minutes of Exercise per Session: Not on file  Stress:   . Feeling of Stress : Not on file  Social Connections:   . Frequency of Communication with Friends and Family: Not on file  . Frequency of Social Gatherings with Friends and Family: Not on file  . Attends Religious Services: Not on file  . Active Member of Clubs or Organizations: Not on file  . Attends Archivist Meetings: Not on file  . Marital Status: Not on file  Intimate Partner Violence:   . Fear of Current or Ex-Partner: Not on file  . Emotionally Abused: Not on file  . Physically Abused: Not on file  . Sexually Abused: Not on file      PHYSICAL EXAM  Vitals:    12/14/19 1518  BP: 132/78  Pulse: 87  Temp: 97.7 F (36.5 C)  TempSrc: Oral  Weight: 214 lb (97.1 kg)  Height: 5\' 6"  (1.676 m)   Body mass index is 34.54 kg/m.  Generalized: Well developed, in no acute distress  Cardiology: normal rate and rhythm, no murmur noted Respiratory: clear to auscultation bilaterally  Neurological examination  Mentation: Alert oriented to time, place, history taking. Follows all commands speech and language fluent Cranial nerve II-XII: Pupils were equal round reactive to light. Extraocular movements were full, visual field  were full on confrontational test. Facial sensation and strength were normal. Uvula tongue midline. Head turning and shoulder shrug  were normal and symmetric. Motor: The motor testing reveals 5 over 5 strength of all 4 extremities. Good symmetric motor tone is noted throughout.  Sensory: Sensory testing is intact to soft touch on all 4 extremities. No evidence of extinction is noted.  Coordination: Cerebellar testing reveals good finger-nose-finger and heel-to-shin bilaterally.  Gait and station: Gait is normal.   DIAGNOSTIC DATA (LABS, IMAGING, TESTING) - I reviewed patient records, labs, notes, testing and imaging myself where available.  No flowsheet data found.   Lab Results  Component Value Date   WBC 8.5 02/22/2018   HGB 15.4 (H) 02/22/2018   HCT 45.8 02/22/2018   MCV 85.3 02/22/2018   PLT 312 02/22/2018      Component Value Date/Time   NA 139 02/22/2018 1451   NA 139 01/14/2017 1536   K 4.0 02/22/2018 1451   CL 102 02/22/2018 1451   CO2 27 02/22/2018 1451   GLUCOSE 167 (H) 02/22/2018 1451   BUN 12 02/22/2018 1451   BUN 13 01/14/2017 1536   CREATININE 1.12 (H) 02/22/2018 1451   CALCIUM 9.8 02/22/2018 1451   PROT 6.7 01/14/2017 1536   ALBUMIN 4.1 01/14/2017 1536   AST 17 01/14/2017 1536   ALT 14 01/14/2017 1536   ALKPHOS 93 01/14/2017 1536   BILITOT 0.2 01/14/2017 1536   GFRNONAA 57 (L) 02/22/2018 1451    GFRAA >60 02/22/2018 1451   Lab Results  Component Value Date   CHOL 213 (H) 01/14/2017   HDL 42 01/14/2017   LDLCALC 116 (H) 01/14/2017   TRIG 274 (H) 01/14/2017   CHOLHDL 5.1 (H) 01/14/2017   Lab Results  Component Value Date   HGBA1C 6.1 (H) 01/14/2017   No results found for: VITAMINB12 Lab Results  Component Value Date   TSH 1.730 01/14/2017     ASSESSMENT AND PLAN 49 y.o. year old female  has a past medical history of Allergy, Anxiety, Arthritis, Bronchitis, Chest congestion, GERD (gastroesophageal reflux disease), Headache, Heart murmur, Hypercholesterolemia, Pneumonia, Shortness of breath dyspnea, and Sinus congestion. here with     ICD-10-CM   1. Migraine with aura and without status migrainosus, not intractable  G43.109     Narissa is doing well, overall. She does note an increase in headaches but contributes this to stress and weather changes. We will increase amitriptyline to 1-2 tablets at bedtime. She will continue sumatriptan as needed for migraine abortion. Well balanced diet and regular exercise advised. She will stay well hydrated. Follow up in 1 year, sooner if needed.    No orders of the defined types were placed in this encounter.    Meds ordered this encounter  Medications  . amitriptyline (ELAVIL) 25 MG tablet    Sig: Take 1-2 tablets every night at bedtime    Dispense:  60 tablet    Refill:  12    Order Specific Question:   Supervising Provider    Answer:   Melvenia Beam [id number]  . SUMAtriptan (IMITREX) 50 MG tablet    Sig: May repeat in 2 hours as needed. Max 2 tabs per day. Max 8 tabs per month.    Dispense:  8 tablet    Refill:  6    Order Specific Question:   Supervising Provider    Answer:   Melvenia Beam [id number]      I spent 20 minutes with the patient. 50%  of this time was spent counseling and educating patient on plan of care and medications.    Debbora Presto, FNP-C 12/14/2019, 3:53 PM [facility] [address] [place], [place] [address] [phone]

## 2019-12-14 NOTE — Patient Instructions (Addendum)
We will increase amitriptyline 25-50mg  daily at bedtime   We will continue Imitrex as needed for abortive therapy  Stay well hydrated, eat a well balanced diet and get regular exercise.   Follow up in 1 year, sooner if needed   Migraine Headache A migraine headache is a very strong throbbing pain on one side or both sides of your head. This type of headache can also cause other symptoms. It can last from 4 hours to 3 days. Talk with your doctor about what things may bring on (trigger) this condition. What are the causes? The exact cause of this condition is not known. This condition may be triggered or caused by:  Drinking alcohol.  Smoking.  Taking medicines, such as: ? Medicine used to treat chest pain (nitroglycerin). ? Birth control pills. ? Estrogen. ? Some blood pressure medicines.  Eating or drinking certain products.  Doing physical activity. Other things that may trigger a migraine headache include:  Having a menstrual period.  Pregnancy.  Hunger.  Stress.  Not getting enough sleep or getting too much sleep.  Weather changes.  Tiredness (fatigue). What increases the risk?  Being 40-43 years old.  Being female.  Having a family history of migraine headaches.  Being Caucasian.  Having depression or anxiety.  Being very overweight. What are the signs or symptoms?  A throbbing pain. This pain may: ? Happen in any area of the head, such as on one side or both sides. ? Make it hard to do daily activities. ? Get worse with physical activity. ? Get worse around bright lights or loud noises.  Other symptoms may include: ? Feeling sick to your stomach (nauseous). ? Vomiting. ? Dizziness. ? Being sensitive to bright lights, loud noises, or smells.  Before you get a migraine headache, you may get warning signs (an aura). An aura may include: ? Seeing flashing lights or having blind spots. ? Seeing bright spots, halos, or zigzag lines. ? Having  tunnel vision or blurred vision. ? Having numbness or a tingling feeling. ? Having trouble talking. ? Having weak muscles.  Some people have symptoms after a migraine headache (postdromal phase), such as: ? Tiredness. ? Trouble thinking (concentrating). How is this treated?  Taking medicines that: ? Relieve pain. ? Relieve the feeling of being sick to your stomach. ? Prevent migraine headaches.  Treatment may also include: ? Having acupuncture. ? Avoiding foods that bring on migraine headaches. ? Learning ways to control your body functions (biofeedback). ? Therapy to help you know and deal with negative thoughts (cognitive behavioral therapy). Follow these instructions at home: Medicines  Take over-the-counter and prescription medicines only as told by your doctor.  Ask your doctor if the medicine prescribed to you: ? Requires you to avoid driving or using heavy machinery. ? Can cause trouble pooping (constipation). You may need to take these steps to prevent or treat trouble pooping:  Drink enough fluid to keep your pee (urine) pale yellow.  Take over-the-counter or prescription medicines.  Eat foods that are high in fiber. These include beans, whole grains, and fresh fruits and vegetables.  Limit foods that are high in fat and sugar. These include fried or sweet foods. Lifestyle  Do not drink alcohol.  Do not use any products that contain nicotine or tobacco, such as cigarettes, e-cigarettes, and chewing tobacco. If you need help quitting, ask your doctor.  Get at least 8 hours of sleep every night.  Limit and deal with stress. General instructions  Keep a journal to find out what may bring on your migraine headaches. For example, write down: ? What you eat and drink. ? How much sleep you get. ? Any change in what you eat or drink. ? Any change in your medicines.  If you have a migraine headache: ? Avoid things that make your symptoms worse, such as  bright lights. ? It may help to lie down in a dark, quiet room. ? Do not drive or use heavy machinery. ? Ask your doctor what activities are safe for you.  Keep all follow-up visits as told by your doctor. This is important. Contact a doctor if:  You get a migraine headache that is different or worse than others you have had.  You have more than 15 headache days in one month. Get help right away if:  Your migraine headache gets very bad.  Your migraine headache lasts longer than 72 hours.  You have a fever.  You have a stiff neck.  You have trouble seeing.  Your muscles feel weak or like you cannot control them.  You start to lose your balance a lot.  You start to have trouble walking.  You pass out (faint).  You have a seizure. Summary  A migraine headache is a very strong throbbing pain on one side or both sides of your head. These headaches can also cause other symptoms.  This condition may be treated with medicines and changes to your lifestyle.  Keep a journal to find out what may bring on your migraine headaches.  Contact a doctor if you get a migraine headache that is different or worse than others you have had.  Contact your doctor if you have more than 15 headache days in a month. This information is not intended to replace advice given to you by your health care provider. Make sure you discuss any questions you have with your health care provider. Document Revised: 01/27/2019 Document Reviewed: 11/17/2018 Elsevier Patient Education  [place].

## 2019-12-15 NOTE — Progress Notes (Signed)
I reviewed note and agree with plan.   Penni Bombard, MD [id number], [id number] AM Certified in Neurology, Neurophysiology and Neuroimaging  [facility] [address] [place], [place] [address] [phone]

## 2020-01-14 ENCOUNTER — Other Ambulatory Visit: Payer: Self-pay | Admitting: Diagnostic Neuroimaging

## 2020-01-20 ENCOUNTER — Other Ambulatory Visit: Payer: Self-pay | Admitting: Diagnostic Neuroimaging

## 2020-03-19 ENCOUNTER — Ambulatory Visit (INDEPENDENT_AMBULATORY_CARE_PROVIDER_SITE_OTHER): Payer: Managed Care, Other (non HMO) | Admitting: Family Medicine

## 2020-05-02 ENCOUNTER — Encounter: Payer: Self-pay | Admitting: Internal Medicine

## 2020-05-24 ENCOUNTER — Encounter: Payer: Commercial Managed Care - PPO | Admitting: Obstetrics & Gynecology

## 2020-06-21 ENCOUNTER — Ambulatory Visit (AMBULATORY_SURGERY_CENTER): Payer: Self-pay | Admitting: *Deleted

## 2020-06-21 ENCOUNTER — Other Ambulatory Visit: Payer: Self-pay

## 2020-06-21 ENCOUNTER — Telehealth: Payer: Self-pay | Admitting: *Deleted

## 2020-06-21 VITALS — Ht 67.0 in | Wt 200.0 lb

## 2020-06-21 DIAGNOSIS — Z1211 Encounter for screening for malignant neoplasm of colon: Secondary | ICD-10-CM

## 2020-06-21 MED ORDER — SUTAB 1479-225-188 MG PO TABS: 1.0000 | ORAL_TABLET | Freq: Once | ORAL | 0 refills | Status: AC

## 2020-06-21 NOTE — Progress Notes (Signed)

## 2020-06-25 NOTE — Telephone Encounter (Signed)
Virtual pre visit completed.  

## 2020-06-26 ENCOUNTER — Ambulatory Visit: Payer: Managed Care, Other (non HMO) | Admitting: Nurse Practitioner

## 2020-06-26 ENCOUNTER — Other Ambulatory Visit (INDEPENDENT_AMBULATORY_CARE_PROVIDER_SITE_OTHER): Payer: Managed Care, Other (non HMO)

## 2020-06-26 ENCOUNTER — Encounter: Payer: Self-pay | Admitting: Nurse Practitioner

## 2020-06-26 ENCOUNTER — Encounter: Payer: Self-pay | Admitting: Internal Medicine

## 2020-06-26 VITALS — BP 116/70 | HR 80 | Ht 67.0 in | Wt 208.0 lb

## 2020-06-26 DIAGNOSIS — K219 Gastro-esophageal reflux disease without esophagitis: Secondary | ICD-10-CM

## 2020-06-26 DIAGNOSIS — R140 Abdominal distension (gaseous): Secondary | ICD-10-CM

## 2020-06-26 LAB — IGA: IgA: 203 mg/dL (ref 68–378)

## 2020-06-26 MED ORDER — FAMOTIDINE 20 MG PO TABS: 20.0000 mg | ORAL_TABLET | Freq: Every day | ORAL | 3 refills | Status: DC

## 2020-06-26 NOTE — Patient Instructions (Addendum)
If you are age 51 or older, your body mass index should be between 23-30. Your Body mass index is 32.58 kg/m. If this is out of the aforementioned range listed, please consider follow up with your Primary Care Provider.  If you are age 69 or younger, your body mass index should be between 19-25. Your Body mass index is 32.58 kg/m. If this is out of the aformentioned range listed, please consider follow up with your Primary Care Provider.   Your provider has requested that you go to the basement level for lab work before leaving today. Press "B" on the elevator. The lab is located at the first door on the left as you exit the elevator.   We have sent the following medications to your pharmacy for you to pick up at your convenience: Pepcid 20 mg take 20-30 minutes prior to dinner.  Follow up in 4-6 weeks.

## 2020-06-26 NOTE — Progress Notes (Signed)
ASSESSMENT AND PLAN    # GERD, with belching / regurgitation of food pieces --Symptomatic 7-8 times a month --Hasn't been treated other than with TUMs or pepto.  --Trial of Pepcid 20 mg q pm before dinner --anti-reflux measures discovered / written information given.  --Return in October for follow up.  --I don't see an indication to add on EGD to colonoscopy at this point. Will first try anti-reflux measures and Pepcid.   # Gas / Bloating associated with gluten intake --tTg, IgA. It doesn't sound like she totally avoids gluten so hopefully results will be reliable --Gas brochure given  # Colon cancer screening --for screening colonoscopy later this month. ` --She is going to check on rescheduling, concerned about staying awake during 2nd portion of bowel prep.     HISTORY OF PRESENT ILLNESS     Primary Gastroenterologist : Zenovia Jarred, MD  Chief Complaint : belching, acid reflux  Crystal Harris is a 49 y.o. female with PMH / [place] significant for,  but not necessarily limited to: hyperlipidemia, arthritis.   Patient is new to the practice, scheduled for a screening colonoscopy later this month. She comes in with GERD symptoms, interested in adding on an EGD to the colonoscopy. Sometimes several hours after eating she will burp and regurgitate pieces of food food. This occurs 7-8 times a month. She gets heartburn about 3-4 times a month, mainly postprandial or close to bedtime. Symptoms present for many years, maybe 10 years. She wonders if stress has anything to do with it, says she has a highly stressful job and family. She has never tried an H2 blocker or PPI. Tum and pepto lessen symptoms. Tries to avoid spicy foods. Doesn't snack after dinner which is generally around 6 or 7 pm. Drinks 2 cups of coffee in am, sometimes drinks tea in afternoon. Avoids sodas.  She gets nauseated 2-3 times a month, doesn't vomit.  No dysphagia. Weight is stable.   Patient offers that  she may be intolerant to Gluten as consumption of it leads to gas / bloating. Her brother has the same problems.     Past Medical History:  Diagnosis Date  . Allergy   . Anxiety    Claustophobic - no meds0  . Arthritis    lower back, rupture disk L5, bulging disk L4, no meds  . Bronchitis   . Chest congestion   . GERD (gastroesophageal reflux disease)    occasional, no meds, diet controlled  . Headache    Migraines  . Heart murmur    mild during pregnancy, never caused any problems  . Hypercholesterolemia   . Pneumonia   . Shortness of breath dyspnea   . Sinus congestion      Past Surgical History:  Procedure Laterality Date  . ABDOMINAL HYSTERECTOMY N/A 08/11/2016   Procedure: HYSTERECTOMY ABDOMINAL;  Surgeon: Terrance Mass, MD;  Location: [facility];  Service: Gynecology;  Laterality: N/A;  . BARTHOLIN CYST MARSUPIALIZATION    . BILATERAL SALPINGECTOMY Bilateral 08/11/2016   Procedure: BILATERAL SALPINGECTOMY;  Surgeon: Terrance Mass, MD;  Location: [facility];  Service: Gynecology;  Laterality: Bilateral;  . CESAREAN SECTION     x 1  . WISDOM TOOTH EXTRACTION     Family History  Problem Relation Age of Onset  . Diabetes Mother   . Hyperlipidemia Mother   . Emphysema Mother   . Cancer Father        brain  . Hypertension Father   .  Cancer Sister        lung to brain  . Hypertension Sister   . Hyperlipidemia Brother   . Breast cancer Paternal Grandmother   . Blindness Paternal Grandmother   . Emphysema Maternal Grandmother   . Stroke Maternal Grandfather   . Colon cancer Neg Hx   . Esophageal cancer Neg Hx   . Rectal cancer Neg Hx   . Stomach cancer Neg Hx    Social History   Tobacco Use  . Smoking status: Never Smoker  . Smokeless tobacco: Never Used  Vaping Use  . Vaping Use: Never used  Substance Use Topics  . Alcohol use: Yes    Alcohol/week: 0.0 standard drinks    Comment: minimal  . Drug use: No    Comment: none since early 20's   Current  Outpatient Medications  Medication Sig Dispense Refill  . amitriptyline (ELAVIL) 25 MG tablet Take 1-2 tablets every night at bedtime 60 tablet 12  . cetirizine (ZYRTEC) 10 MG chewable tablet Chew 10 mg by mouth daily.    . rosuvastatin (CRESTOR) 10 MG tablet Take 10 mg by mouth at bedtime.    . SUMAtriptan (IMITREX) 50 MG tablet May repeat in 2 hours as needed. Max 2 tabs per day. Max 8 tabs per month. 8 tablet 6   No current facility-administered medications for this visit.   No Known Allergies   Review of Systems:  All systems reviewed and negative except where noted in HPI.   PHYSICAL EXAM :    Wt Readings from Last 3 Encounters:  06/21/20 200 lb (90.7 kg)  12/14/19 214 lb (97.1 kg)  05/23/19 214 lb (97.1 kg)    LMP 08/02/2016  Constitutional:  Pleasant female in no acute distress. Psychiatric: Normal mood and affect. Behavior is normal. EENT: Pupils normal.  Conjunctivae are normal. No scleral icterus. Neck supple.  Cardiovascular: Normal rate, regular rhythm. No edema Pulmonary/chest: Effort normal and breath sounds normal. No wheezing, rales or rhonchi. Abdominal: Soft, nondistended, nontender. Bowel sounds active throughout. There are no masses palpable. No hepatomegaly. Neurological: Alert and oriented to person place and time. Skin: Skin is warm and dry. No rashes noted.  Tye Savoy, NP  06/26/2020, 8:41 AM

## 2020-06-26 NOTE — Progress Notes (Signed)
Addendum: Reviewed and agree with assessment and management plan. Latarsha Zani M, MD  

## 2020-06-28 LAB — TISSUE TRANSGLUTAMINASE ABS,IGG,IGA
(tTG) Ab, IgA: 1 U/mL
(tTG) Ab, IgG: 1 U/mL

## 2020-06-30 ENCOUNTER — Encounter: Payer: Self-pay | Admitting: Certified Registered Nurse Anesthetist

## 2020-07-01 ENCOUNTER — Other Ambulatory Visit: Payer: Self-pay

## 2020-07-01 ENCOUNTER — Encounter: Payer: Self-pay | Admitting: Internal Medicine

## 2020-07-01 ENCOUNTER — Ambulatory Visit (AMBULATORY_SURGERY_CENTER): Payer: Managed Care, Other (non HMO) | Admitting: Internal Medicine

## 2020-07-01 VITALS — BP 119/81 | HR 76 | Temp 97.3°F | Resp 14 | Ht 67.0 in | Wt 200.0 lb

## 2020-07-01 DIAGNOSIS — D123 Benign neoplasm of transverse colon: Secondary | ICD-10-CM | POA: Diagnosis not present

## 2020-07-01 DIAGNOSIS — D122 Benign neoplasm of ascending colon: Secondary | ICD-10-CM | POA: Diagnosis not present

## 2020-07-01 DIAGNOSIS — Z1211 Encounter for screening for malignant neoplasm of colon: Secondary | ICD-10-CM | POA: Diagnosis present

## 2020-07-01 DIAGNOSIS — D125 Benign neoplasm of sigmoid colon: Secondary | ICD-10-CM

## 2020-07-01 MED ORDER — SODIUM CHLORIDE 0.9 % IV SOLN: 500.0000 mL | Freq: Once | INTRAVENOUS | Status: DC

## 2020-07-01 NOTE — Progress Notes (Signed)
Pt's states no medical or surgical changes since previsit or office visit.  SB - vitals 

## 2020-07-01 NOTE — Progress Notes (Signed)
Called to room to assist during endoscopic procedure.  Patient ID and intended procedure confirmed with present staff. Received instructions for my participation in the procedure from the performing physician.  

## 2020-07-01 NOTE — Op Note (Signed)
[facility] Patient Name: Crystal Harris Procedure Date: 07/01/2020 1:29 PM MRN: [id number] Endoscopist: Jerene Bears , MD Age: 49 Referring MD:  Date of Birth: Jul 14, 1971 Gender: Female Account #: [id number] Procedure:                Colonoscopy Indications:              Screening for colorectal malignant neoplasm, This                            is the patient's first colonoscopy Medicines:                Monitored Anesthesia Care Procedure:                Pre-Anesthesia Assessment:                           - Prior to the procedure, a History and Physical                            was performed, and patient medications and                            allergies were reviewed. The patient's tolerance of                            previous anesthesia was also reviewed. The risks                            and benefits of the procedure and the sedation                            options and risks were discussed with the patient.                            All questions were answered, and informed consent                            was obtained. Prior Anticoagulants: The patient has                            taken no previous anticoagulant or antiplatelet                            agents. ASA Grade Assessment: II - A patient with                            mild systemic disease. After reviewing the risks                            and benefits, the patient was deemed in                            satisfactory condition to undergo the procedure.  After obtaining informed consent, the colonoscope                            was passed under direct vision. Throughout the                            procedure, the patient's blood pressure, pulse, and                            oxygen saturations were monitored continuously. The                            Colonoscope was introduced through the anus and                            advanced to the cecum,  identified by appendiceal                            orifice and ileocecal valve. The colonoscopy was                            performed without difficulty. The patient tolerated                            the procedure well. The quality of the bowel                            preparation was good. The ileocecal valve,                            appendiceal orifice, and rectum were photographed. Scope In: 1:40:02 PM Scope Out: 1:58:48 PM Scope Withdrawal Time: 0 hours 12 minutes 58 seconds  Total Procedure Duration: 0 hours 18 minutes 46 seconds  Findings:                 The digital rectal exam was normal.                           A 4 mm polyp was found in the ascending colon. The                            polyp was sessile. The polyp was removed with a                            cold snare. Resection and retrieval were complete.                           A 3 mm polyp was found in the transverse colon. The                            polyp was sessile. The polyp was removed with a  cold snare. Resection and retrieval were complete.                           A 4 mm polyp was found in the sigmoid colon. The                            polyp was sessile. The polyp was removed with a                            cold snare. Resection and retrieval were complete.                           A few small-mouthed diverticula were found in the                            sigmoid colon.                           The retroflexed view of the distal rectum and anal                            verge was normal and showed no anal or rectal                            abnormalities. Complications:            No immediate complications. Estimated Blood Loss:     Estimated blood loss was minimal. Impression:               - One 4 mm polyp in the ascending colon, removed                            with a cold snare. Resected and retrieved.                           - One 3 mm polyp in  the transverse colon, removed                            with a cold snare. Resected and retrieved.                           - One 4 mm polyp in the sigmoid colon, removed with                            a cold snare. Resected and retrieved.                           - Diverticulosis in the sigmoid colon.                           - The distal rectum and anal verge are normal on                            retroflexion view.  Recommendation:           - Patient has a contact number available for                            emergencies. The signs and symptoms of potential                            delayed complications were discussed with the                            patient. Return to normal activities tomorrow.                            Written discharge instructions were provided to the                            patient.                           - Resume previous diet.                           - Continue present medications.                           - Await pathology results.                           - Repeat colonoscopy is recommended. The                            colonoscopy date will be determined after pathology                            results from today's exam become available for                            review. Jerene Bears, MD 07/01/2020 2:01:17 PM This report has been signed electronically.

## 2020-07-01 NOTE — Patient Instructions (Signed)
 Handouts on polyps & diverticulosis given to you today  Await pathology results on polyps removed    YOU HAD AN ENDOSCOPIC PROCEDURE TODAY AT THE [facility]:   Refer to the procedure report that was given to you for any specific questions about what was found during the examination.  If the procedure report does not answer your questions, please call your gastroenterologist to clarify.  If you requested that your care partner not be given the details of your procedure findings, then the procedure report has been included in a sealed envelope for you to review at your convenience later.  YOU SHOULD EXPECT: Some feelings of bloating in the abdomen. Passage of more gas than usual.  Walking can help get rid of the air that was put into your GI tract during the procedure and reduce the bloating. If you had a lower endoscopy (such as a colonoscopy or flexible sigmoidoscopy) you may notice spotting of blood in your stool or on the toilet paper. If you underwent a bowel prep for your procedure, you may not have a normal bowel movement for a few days.  Please Note:  You might notice some irritation and congestion in your nose or some drainage.  This is from the oxygen used during your procedure.  There is no need for concern and it should clear up in a day or so.  SYMPTOMS TO REPORT IMMEDIATELY:   Following lower endoscopy (colonoscopy or flexible sigmoidoscopy):  Excessive amounts of blood in the stool  Significant tenderness or worsening of abdominal pains  Swelling of the abdomen that is new, acute  Fever of 100F or higher   For urgent or emergent issues, a gastroenterologist can be reached at any hour by calling [phone]. Do not use MyChart messaging for urgent concerns.    DIET:  We do recommend a small meal at first, but then you may proceed to your regular diet.  Drink plenty of fluids but you should avoid alcoholic beverages for 24 hours.  ACTIVITY:  You should plan  to take it easy for the rest of today and you should NOT DRIVE or use heavy machinery until tomorrow (because of the sedation medicines used during the test).    FOLLOW UP: Our staff will call the number listed on your records 48-72 hours following your procedure to check on you and address any questions or concerns that you may have regarding the information given to you following your procedure. If we do not reach you, we will leave a message.  We will attempt to reach you two times.  During this call, we will ask if you have developed any symptoms of COVID 19. If you develop any symptoms (ie: fever, flu-like symptoms, shortness of breath, cough etc.) before then, please call [phone].  If you test positive for Covid 19 in the 2 weeks post procedure, please call and report this information to us.    If any biopsies were taken you will be contacted by phone or by letter within the next 1-3 weeks.  Please call us at [phone] if you have not heard about the biopsies in 3 weeks.    SIGNATURES/CONFIDENTIALITY: You and/or your care partner have signed paperwork which will be entered into your electronic medical record.  These signatures attest to the fact that that the information above on your After Visit Summary has been reviewed and is understood.  Full responsibility of the confidentiality of this discharge information lies with you and/or   you and/or your care-partner.

## 2020-07-01 NOTE — Progress Notes (Signed)
Report given to PACU, vss 

## 2020-07-03 ENCOUNTER — Telehealth: Payer: Self-pay

## 2020-07-03 NOTE — Telephone Encounter (Signed)
  Follow up Call-  Call back number 07/01/2020  Post procedure Call Back phone  # [phone]  Permission to leave phone message Yes  Some recent data might be hidden     Patient questions:  Do you have a fever, pain , or abdominal swelling? No. Pain Score  0 *  Have you tolerated food without any problems? Yes.    Have you been able to return to your normal activities? Yes.    Do you have any questions about your discharge instructions: Diet   No. Medications  No. Follow up visit  No.  Do you have questions or concerns about your Care? No.  Actions: * If pain score is 4 or above: No action needed, pain <4. 1. Have you developed a fever since your procedure? no  2.   Have you had an respiratory symptoms (SOB or cough) since your procedure? no  3.   Have you tested positive for COVID 19 since your procedure no  4.   Have you had any family members/close contacts diagnosed with the COVID 19 since your procedure?  no   If yes to any of these questions please route to Joylene John, RN and Joella Prince, RN

## 2020-07-04 ENCOUNTER — Encounter: Payer: Self-pay | Admitting: Obstetrics & Gynecology

## 2020-07-04 ENCOUNTER — Other Ambulatory Visit: Payer: Self-pay

## 2020-07-04 ENCOUNTER — Ambulatory Visit: Payer: Managed Care, Other (non HMO) | Admitting: Obstetrics & Gynecology

## 2020-07-04 VITALS — BP 122/78 | Ht 67.0 in | Wt 208.0 lb

## 2020-07-04 DIAGNOSIS — Z6832 Body mass index (BMI) 32.0-32.9, adult: Secondary | ICD-10-CM | POA: Diagnosis not present

## 2020-07-04 DIAGNOSIS — Z90710 Acquired absence of both cervix and uterus: Secondary | ICD-10-CM

## 2020-07-04 DIAGNOSIS — E6609 Other obesity due to excess calories: Secondary | ICD-10-CM

## 2020-07-04 DIAGNOSIS — Z01419 Encounter for gynecological examination (general) (routine) without abnormal findings: Secondary | ICD-10-CM

## 2020-07-04 NOTE — Progress Notes (Signed)
Crystal Harris 04/05/1971 [id number]   History:    49 y.o. G3P2A1L2 Boyfriend x 15 years.  Sons 25+ and 63 yo.  RP:  Established patient presenting for annual gyn exam   HPI: S/P TAH/Bilateral salpingectomy for Fibroids in 09/2016. No vasomotor menopausal Sx. No pelvic pain. No pain with IC. Urine/BMs wnl. Breasts wnl. BMI decreased to 32.58.  Needs to improve fitness and nutrition. Health labs with Fam MD.  Crystal Harris 06/2020.  Past medical history,surgical history, family history and social history were all reviewed and documented in the EPIC chart.  Gynecologic History Patient's last menstrual period was 08/02/2016.  Obstetric History OB History  Gravida Para Term Preterm AB Living  3 2     1 2   SAB TAB Ectopic Multiple Live Births               # Outcome Date GA Lbr Len/2nd Weight Sex Delivery Anes PTL Lv  3 AB           2 Para           1 Para              ROS: A ROS was performed and pertinent positives and negatives are included in the history.  GENERAL: No fevers or chills. HEENT: No change in vision, no earache, sore throat or sinus congestion. NECK: No pain or stiffness. CARDIOVASCULAR: No chest pain or pressure. No palpitations. PULMONARY: No shortness of breath, cough or wheeze. GASTROINTESTINAL: No abdominal pain, nausea, vomiting or diarrhea, melena or bright red blood per rectum. GENITOURINARY: No urinary frequency, urgency, hesitancy or dysuria. MUSCULOSKELETAL: No joint or muscle pain, no back pain, no recent trauma. DERMATOLOGIC: No rash, no itching, no lesions. ENDOCRINE: No polyuria, polydipsia, no heat or cold intolerance. No recent change in weight. HEMATOLOGICAL: No anemia or easy bruising or bleeding. NEUROLOGIC: No headache, seizures, numbness, tingling or weakness. PSYCHIATRIC: No depression, no loss of interest in normal activity or change in sleep pattern.     Exam:   BP 122/78 (BP Location: Right Arm, Patient Position: Sitting, Cuff  Size: Large)   Ht 5\' 7"  (1.702 m)   Wt 208 lb (94.3 kg)   LMP 08/02/2016   BMI 32.58 kg/m   Body mass index is 32.58 kg/m.  General appearance : Well developed well nourished female. No acute distress HEENT: Eyes: no retinal hemorrhage or exudates,  Neck supple, trachea midline, no carotid bruits, no thyroidmegaly Lungs: Clear to auscultation, no rhonchi or wheezes, or rib retractions  Heart: Regular rate and rhythm, no murmurs or gallops Breast:Examined in sitting and supine position were symmetrical in appearance, no palpable masses or tenderness,  no skin retraction, no nipple inversion, no nipple discharge, no skin discoloration, no axillary or supraclavicular lymphadenopathy Abdomen: no palpable masses or tenderness, no rebound or guarding Extremities: no edema or skin discoloration or tenderness  Pelvic: Vulva: Normal             Vagina: No gross lesions or discharge  Cervix/Uterus absent  Adnexa  Without masses or tenderness  Anus: Normal   Assessment/Plan:  49 y.o. female for annual exam   1. Well female exam with routine gynecological exam Gynecologic exam status post TAH. No indication to repeat a Pap test this year. Breast exam normal. Screening mammograms February 2021 Negative. Colonoscopy September 2021. Health labs with family physician.  2. S/P TAH (total abdominal hysterectomy)  3. Class 1 obesity due to excess calories without serious comorbidity with  body mass index (BMI) of 32.0 to 32.9 in adult Recommend a lower calorie/carb diet. Aerobic activities 5 times a week and light weightlifting every 2 days.  [name] MD, 3:48 PM 07/04/2020

## 2020-07-08 ENCOUNTER — Encounter: Payer: Managed Care, Other (non HMO) | Admitting: Internal Medicine

## 2020-07-08 ENCOUNTER — Encounter: Payer: Self-pay | Admitting: Internal Medicine

## 2020-07-12 ENCOUNTER — Encounter: Payer: Self-pay | Admitting: Obstetrics & Gynecology

## 2020-07-31 ENCOUNTER — Ambulatory Visit: Payer: Self-pay | Admitting: Nurse Practitioner

## 2020-10-30 ENCOUNTER — Other Ambulatory Visit: Payer: Self-pay | Admitting: Obstetrics & Gynecology

## 2020-10-30 DIAGNOSIS — Z1231 Encounter for screening mammogram for malignant neoplasm of breast: Secondary | ICD-10-CM

## 2020-12-16 ENCOUNTER — Ambulatory Visit
Admission: RE | Admit: 2020-12-16 | Discharge: 2020-12-16 | Disposition: A | Discharge status: Home / Self Care | Payer: Managed Care, Other (non HMO) | Source: Ambulatory Visit | Attending: Obstetrics & Gynecology | Admitting: Obstetrics & Gynecology

## 2020-12-16 ENCOUNTER — Other Ambulatory Visit: Payer: Self-pay

## 2020-12-16 DIAGNOSIS — Z1231 Encounter for screening mammogram for malignant neoplasm of breast: Secondary | ICD-10-CM

## 2020-12-19 ENCOUNTER — Ambulatory Visit: Payer: Managed Care, Other (non HMO) | Admitting: Family Medicine

## 2020-12-19 ENCOUNTER — Encounter: Payer: Self-pay | Admitting: Family Medicine

## 2020-12-19 VITALS — BP 123/80 | HR 81 | Ht 67.0 in | Wt 205.0 lb

## 2020-12-19 DIAGNOSIS — G43109 Migraine with aura, not intractable, without status migrainosus: Secondary | ICD-10-CM | POA: Diagnosis not present

## 2020-12-19 MED ORDER — SUMATRIPTAN SUCCINATE 50 MG PO TABS: ORAL_TABLET | ORAL | 5 refills | Status: DC

## 2020-12-19 MED ORDER — AMITRIPTYLINE HCL 25 MG PO TABS: ORAL_TABLET | ORAL | 3 refills | Status: DC

## 2020-12-19 NOTE — Progress Notes (Signed)
PATIENT: Crystal Harris DOB: 1970/11/02  REASON FOR VISIT: follow up HISTORY FROM: patient  Chief Complaint  Patient presents with  . Follow-up    RM 2 alone Pt states migraines haven't been bad, but has headaches due to stress. Sometimes cause dizziness.      HISTORY OF PRESENT ILLNESS: 12/19/20 ALL:  She returns for follow up on migraines. She continues amitriptyline 25-50mg  daily and sumatriptan as needed. Migraines are well managed. She has had more stress recently with changes in her work load which contributes to headaches. She occasionally feels dizzy when she has more stress. No other concerning symptoms. She feels stress is situational and looking for a new job. She has not had to use sumatriptan at all over the past year.   12/14/2019 ALL:  Crystal Harris is a 50 y.o. female here today for follow up for migraines. She reports that, overall, she is doing well. She does note increased stress with work and family that has contributed to more headaches. She also notes that allergy symptoms have played a role. She has 4-6 migraines a year. Eye exam in December, 2020.   HISTORY: (copied from Dr Gladstone Lighter note on 12/12/2018)  UPDATE (12/12/18, VRP): Since last visit, doing well. HA are improved on amitrip. 1 HA every few months. Weather, skipping meals and stress can affect HA. No other alleviating or aggravating factors. Tolerating meds.    PRIOR HPI (05/30/18): 50 year old female here for evaluation of dizziness, vertigo, headaches.  Patient has history of migraine headaches, on the top of her head, throbbing sensation associate with nausea, photophobia, phonophobia.  She has about 6 of these per month which are mild and one severe one every 3 to 4 months.  She uses amitriptyline and sumatriptan with fairly good results.  Patient also having an episode of left arm and leg weakness lasting for 1 to 2 days in May 2019.  This was associated with headache.   Patient went to the emergency room had CT scan head which is unremarkable.  Patient was referred to neurology clinic for further evaluation.   REVIEW OF SYSTEMS: Out of a complete 14 system review of symptoms, the patient complains only of the following symptoms, headaches and all other reviewed systems are negative.   ALLERGIES: No Known Allergies  HOME MEDICATIONS: Outpatient Medications Prior to Visit  Medication Sig Dispense Refill  . cetirizine (ZYRTEC) 10 MG chewable tablet Chew 10 mg by mouth daily.    . rosuvastatin (CRESTOR) 10 MG tablet Take 10 mg by mouth at bedtime.    Marland Kitchen amitriptyline (ELAVIL) 25 MG tablet Take 1-2 tablets every night at bedtime 60 tablet 12  . SUMAtriptan (IMITREX) 50 MG tablet May repeat in 2 hours as needed. Max 2 tabs per day. Max 8 tabs per month. 8 tablet 6   No facility-administered medications prior to visit.    PAST MEDICAL HISTORY: Past Medical History:  Diagnosis Date  . Allergy   . Anxiety    Claustophobic - no meds0  . Arthritis    lower back, rupture disk L5, bulging disk L4, no meds  . Bronchitis   . Chest congestion   . GERD (gastroesophageal reflux disease)    occasional, no meds, diet controlled  . Headache    Migraines  . Heart murmur    mild during pregnancy, never caused any problems  . Hypercholesterolemia   . Pneumonia   . Shortness of breath dyspnea   . Sinus congestion  PAST SURGICAL HISTORY: Past Surgical History:  Procedure Laterality Date  . ABDOMINAL HYSTERECTOMY N/A 08/11/2016   Procedure: HYSTERECTOMY ABDOMINAL;  Surgeon: Terrance Mass, MD;  Location: [facility];  Service: Gynecology;  Laterality: N/A;  . BARTHOLIN CYST MARSUPIALIZATION    . BILATERAL SALPINGECTOMY Bilateral 08/11/2016   Procedure: BILATERAL SALPINGECTOMY;  Surgeon: Terrance Mass, MD;  Location: [facility];  Service: Gynecology;  Laterality: Bilateral;  . BREAST BIOPSY Left 11/23/2019  . CESAREAN SECTION     x 1  . WISDOM TOOTH  EXTRACTION      FAMILY HISTORY: Family History  Problem Relation Age of Onset  . Diabetes Mother   . Hyperlipidemia Mother   . Emphysema Mother   . Cancer Father        brain  . Hypertension Father   . Cancer Sister        lung to brain  . Hypertension Sister   . Hyperlipidemia Brother   . Breast cancer Paternal Grandmother   . Blindness Paternal Grandmother   . Emphysema Maternal Grandmother   . Stroke Maternal Grandfather   . Colon cancer Neg Hx   . Esophageal cancer Neg Hx   . Rectal cancer Neg Hx   . Stomach cancer Neg Hx     SOCIAL HISTORY: Social History   Socioeconomic History  . Marital status: Divorced    Spouse name: Not on file  . Number of children: 2  . Years of education: comm college  . Highest education level: Not on file  Occupational History    Comment: [employer]  Tobacco Use  . Smoking status: Never Smoker  . Smokeless tobacco: Never Used  Vaping Use  . Vaping Use: Never used  Substance and Sexual Activity  . Alcohol use: Yes    Alcohol/week: 0.0 standard drinks    Comment: minimal  . Drug use: No    Comment: none since early 20's  . Sexual activity: Yes    Partners: Male    Birth control/protection: None    Comment: 1st intercourse- 41, partners- 67, current partner- 13 yrs   Other Topics Concern  . Not on file  Social History Narrative   Lives with boyfriend   Caffeine- 2 cups coffee daily   Social Determinants of Health   Financial Resource Strain: Not on file  Food Insecurity: Not on file  Transportation Needs: Not on file  Physical Activity: Not on file  Stress: Not on file  Social Connections: Not on file  Intimate Partner Violence: Not on file      PHYSICAL EXAM  Vitals:   12/19/20 0843  BP: 123/80  Pulse: 81  Weight: 205 lb (93 kg)  Height: 5\' 7"  (1.702 m)   Body mass index is 32.11 kg/m.  Generalized: Well developed, in no acute distress  Cardiology: normal rate and rhythm, no murmur  noted Respiratory: clear to auscultation bilaterally  Neurological examination  Mentation: Alert oriented to time, place, history taking. Follows all commands speech and language fluent Cranial nerve II-XII: Pupils were equal round reactive to light. Extraocular movements were full, visual field were full on confrontational test. Facial sensation and strength were normal. Head turning and shoulder shrug  were normal and symmetric. Motor: The motor testing reveals 5 over 5 strength of all 4 extremities. Good symmetric motor tone is noted throughout.  Coordination: Cerebellar testing reveals good finger-nose-finger  Gait and station: Gait is normal.   DIAGNOSTIC DATA (LABS, IMAGING, TESTING) - I reviewed patient records,  labs, notes, testing and imaging myself where available.  No flowsheet data found.   Lab Results  Component Value Date   WBC 8.5 02/22/2018   HGB 15.4 (H) 02/22/2018   HCT 45.8 02/22/2018   MCV 85.3 02/22/2018   PLT 312 02/22/2018      Component Value Date/Time   NA 139 02/22/2018 1451   NA 139 01/14/2017 1536   K 4.0 02/22/2018 1451   CL 102 02/22/2018 1451   CO2 27 02/22/2018 1451   GLUCOSE 167 (H) 02/22/2018 1451   BUN 12 02/22/2018 1451   BUN 13 01/14/2017 1536   CREATININE 1.12 (H) 02/22/2018 1451   CALCIUM 9.8 02/22/2018 1451   PROT 6.7 01/14/2017 1536   ALBUMIN 4.1 01/14/2017 1536   AST 17 01/14/2017 1536   ALT 14 01/14/2017 1536   ALKPHOS 93 01/14/2017 1536   BILITOT 0.2 01/14/2017 1536   GFRNONAA 57 (L) 02/22/2018 1451   GFRAA >60 02/22/2018 1451   Lab Results  Component Value Date   CHOL 213 (H) 01/14/2017   HDL 42 01/14/2017   LDLCALC 116 (H) 01/14/2017   TRIG 274 (H) 01/14/2017   CHOLHDL 5.1 (H) 01/14/2017   Lab Results  Component Value Date   HGBA1C 6.1 (H) 01/14/2017   No results found for: VITAMINB12 Lab Results  Component Value Date   TSH 1.730 01/14/2017     ASSESSMENT AND PLAN 50 y.o. year old female  has a past medical  history of Allergy, Anxiety, Arthritis, Bronchitis, Chest congestion, GERD (gastroesophageal reflux disease), Headache, Heart murmur, Hypercholesterolemia, Pneumonia, Shortness of breath dyspnea, and Sinus congestion. here with     ICD-10-CM   1. Migraine with aura and without status migrainosus, not intractable  G43.109      Crystal Harris is doing well, overall. She does note an increase in headaches but contributes this to stress. We will continue amitriptyline to 1-2 tablets at bedtime. She will continue sumatriptan as needed for migraine abortion. She is followed regularly by PCP. May consider CBT if needed. Well balanced diet and regular exercise advised. She will stay well hydrated. She may continue refills with PCP, otherwise, follow up with me in 1 year, sooner if needed.    No orders of the defined types were placed in this encounter.    Meds ordered this encounter  Medications  . SUMAtriptan (IMITREX) 50 MG tablet    Sig: May repeat in 2 hours as needed. Max 2 tabs per day. Max 8 tabs per month.    Dispense:  8 tablet    Refill:  5    Order Specific Question:   Supervising Provider    Answer:   Melvenia Beam [id number]  . amitriptyline (ELAVIL) 25 MG tablet    Sig: Take 1-2 tablets every night at bedtime    Dispense:  180 tablet    Refill:  3    Order Specific Question:   Supervising Provider    Answer:   Melvenia Beam [[id number]]      I spent 20 minutes with the patient. 50% of this time was spent counseling and educating patient on plan of care and medications.    Debbora Presto, FNP-C 12/19/2020, 9:05 AM [facility] [address] [place], [place] [address] [phone]

## 2020-12-19 NOTE — Patient Instructions (Signed)
Below is our plan:  We will continue amitriptyline 25-50mg  at bedtime and sumatriptan as needed   Please make sure you are staying well hydrated. I recommend 50-60 ounces daily. Well balanced diet and regular exercise encouraged. Consistent sleep schedule with 6-8 hours recommended.   Please continue follow up with care team as directed.   Follow up with me in 1 year. May continue refills with Dr Jacalyn Lefevre and see me as needed of you wish.   You may receive a survey regarding today's visit. I encourage you to leave honest feed back as I do use this information to improve patient care. Thank you for seeing me today!      Migraine Headache A migraine headache is a very strong throbbing pain on one side or both sides of your head. This type of headache can also cause other symptoms. It can last from 4 hours to 3 days. Talk with your doctor about what things may bring on (trigger) this condition. What are the causes? The exact cause of this condition is not known. This condition may be triggered or caused by:  Drinking alcohol.  Smoking.  Taking medicines, such as: ? Medicine used to treat chest pain (nitroglycerin). ? Birth control pills. ? Estrogen. ? Some blood pressure medicines.  Eating or drinking certain products.  Doing physical activity. Other things that may trigger a migraine headache include:  Having a menstrual period.  Pregnancy.  Hunger.  Stress.  Not getting enough sleep or getting too much sleep.  Weather changes.  Tiredness (fatigue). What increases the risk?  Being 63-18 years old.  Being female.  Having a family history of migraine headaches.  Being Caucasian.  Having depression or anxiety.  Being very overweight. What are the signs or symptoms?  A throbbing pain. This pain may: ? Happen in any area of the head, such as on one side or both sides. ? Make it hard to do daily activities. ? Get worse with physical activity. ? Get worse  around bright lights or loud noises.  Other symptoms may include: ? Feeling sick to your stomach (nauseous). ? Vomiting. ? Dizziness. ? Being sensitive to bright lights, loud noises, or smells.  Before you get a migraine headache, you may get warning signs (an aura). An aura may include: ? Seeing flashing lights or having blind spots. ? Seeing bright spots, halos, or zigzag lines. ? Having tunnel vision or blurred vision. ? Having numbness or a tingling feeling. ? Having trouble talking. ? Having weak muscles.  Some people have symptoms after a migraine headache (postdromal phase), such as: ? Tiredness. ? Trouble thinking (concentrating). How is this treated?  Taking medicines that: ? Relieve pain. ? Relieve the feeling of being sick to your stomach. ? Prevent migraine headaches.  Treatment may also include: ? Having acupuncture. ? Avoiding foods that bring on migraine headaches. ? Learning ways to control your body functions (biofeedback). ? Therapy to help you know and deal with negative thoughts (cognitive behavioral therapy). Follow these instructions at home: Medicines  Take over-the-counter and prescription medicines only as told by your doctor.  Ask your doctor if the medicine prescribed to you: ? Requires you to avoid driving or using heavy machinery. ? Can cause trouble pooping (constipation). You may need to take these steps to prevent or treat trouble pooping:  Drink enough fluid to keep your pee (urine) pale yellow.  Take over-the-counter or prescription medicines.  Eat foods that are high in fiber. These include beans,  whole grains, and fresh fruits and vegetables.  Limit foods that are high in fat and sugar. These include fried or sweet foods. Lifestyle  Do not drink alcohol.  Do not use any products that contain nicotine or tobacco, such as cigarettes, e-cigarettes, and chewing tobacco. If you need help quitting, ask your doctor.  Get at least 8  hours of sleep every night.  Limit and deal with stress. General instructions  Keep a journal to find out what may bring on your migraine headaches. For example, write down: ? What you eat and drink. ? How much sleep you get. ? Any change in what you eat or drink. ? Any change in your medicines.  If you have a migraine headache: ? Avoid things that make your symptoms worse, such as bright lights. ? It may help to lie down in a dark, quiet room. ? Do not drive or use heavy machinery. ? Ask your doctor what activities are safe for you.  Keep all follow-up visits as told by your doctor. This is important.      Contact a doctor if:  You get a migraine headache that is different or worse than others you have had.  You have more than 15 headache days in one month. Get help right away if:  Your migraine headache gets very bad.  Your migraine headache lasts longer than 72 hours.  You have a fever.  You have a stiff neck.  You have trouble seeing.  Your muscles feel weak or like you cannot control them.  You start to lose your balance a lot.  You start to have trouble walking.  You pass out (faint).  You have a seizure. Summary  A migraine headache is a very strong throbbing pain on one side or both sides of your head. These headaches can also cause other symptoms.  This condition may be treated with medicines and changes to your lifestyle.  Keep a journal to find out what may bring on your migraine headaches.  Contact a doctor if you get a migraine headache that is different or worse than others you have had.  Contact your doctor if you have more than 15 headache days in a month. This information is not intended to replace advice given to you by your health care provider. Make sure you discuss any questions you have with your health care provider. Document Revised: 01/27/2019 Document Reviewed: 11/17/2018 Elsevier Patient Education  [place].

## 2020-12-19 NOTE — Progress Notes (Signed)
I reviewed note and agree with plan.   Penni Bombard, MD 06/26/3381, 5:05 PM Certified in Neurology, Neurophysiology and Neuroimaging  [facility] [address], [place], [place] [address] [phone]

## 2021-07-07 ENCOUNTER — Ambulatory Visit: Payer: Managed Care, Other (non HMO) | Admitting: Obstetrics & Gynecology

## 2021-07-24 ENCOUNTER — Ambulatory Visit (INDEPENDENT_AMBULATORY_CARE_PROVIDER_SITE_OTHER): Payer: Managed Care, Other (non HMO) | Admitting: Obstetrics & Gynecology

## 2021-07-24 ENCOUNTER — Other Ambulatory Visit: Payer: Self-pay

## 2021-07-24 ENCOUNTER — Encounter: Payer: Self-pay | Admitting: Obstetrics & Gynecology

## 2021-07-24 VITALS — BP 114/76 | HR 78 | Resp 16 | Ht 66.0 in | Wt 206.0 lb

## 2021-07-24 DIAGNOSIS — Z90710 Acquired absence of both cervix and uterus: Secondary | ICD-10-CM

## 2021-07-24 DIAGNOSIS — Z01419 Encounter for gynecological examination (general) (routine) without abnormal findings: Secondary | ICD-10-CM

## 2021-07-24 DIAGNOSIS — Z6835 Body mass index (BMI) 35.0-35.9, adult: Secondary | ICD-10-CM

## 2021-07-24 NOTE — Progress Notes (Signed)
Jesly Hartmann Ardis [birth year] [id number]   History:    50 y.o.  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  Boyfriend x 16 years.  Sons 26+ and 31 yo.   RP:  Established patient presenting for annual gyn exam    HPI: S/P TAH/Bilateral salpingectomy for Fibroids in 09/2016.  No vasomotor menopausal Sx.  No pelvic pain.  No pain with IC.  Urine/BMs wnl.  Breasts wnl.  BMI decreased to 33.25.  Needs to improve fitness and nutrition. Health labs with Fam MD.  [name] 06/2020.   Past medical history,surgical history, family history and social history were all reviewed and documented in the EPIC chart.  Gynecologic History Patient's last menstrual period was 08/02/2016.  Obstetric History OB History  Gravida Para Term Preterm AB Living  3 2     1 2   SAB IAB Ectopic Multiple Live Births               # Outcome Date GA Lbr Len/2nd Weight Sex Delivery Anes PTL Lv  3 AB           2 Para           1 Para              ROS: A ROS was performed and pertinent positives and negatives are included in the history.  GENERAL: No fevers or chills. HEENT: No change in vision, no earache, sore throat or sinus congestion. NECK: No pain or stiffness. CARDIOVASCULAR: No chest pain or pressure. No palpitations. PULMONARY: No shortness of breath, cough or wheeze. GASTROINTESTINAL: No abdominal pain, nausea, vomiting or diarrhea, melena or bright red blood per rectum. GENITOURINARY: No urinary frequency, urgency, hesitancy or dysuria. MUSCULOSKELETAL: No joint or muscle pain, no back pain, no recent trauma. DERMATOLOGIC: No rash, no itching, no lesions. ENDOCRINE: No polyuria, polydipsia, no heat or cold intolerance. No recent change in weight. HEMATOLOGICAL: No anemia or easy bruising or bleeding. NEUROLOGIC: No headache, seizures, numbness, tingling or weakness. PSYCHIATRIC: No depression, no loss of interest in normal activity or change in sleep pattern.     Exam:   BP 114/76   Pulse  78   Resp 16   Ht 5\' 6"  (1.676 m)   Wt 206 lb (93.4 kg)   LMP 08/02/2016   BMI 33.25 kg/m   Body mass index is 33.25 kg/m.  General appearance : Well developed well nourished female. No acute distress HEENT: Eyes: no retinal hemorrhage or exudates,  Neck supple, trachea midline, no carotid bruits, no thyroidmegaly Lungs: Clear to auscultation, no rhonchi or wheezes, or rib retractions  Heart: Regular rate and rhythm, no murmurs or gallops Breast:Examined in sitting and supine position were symmetrical in appearance, no palpable masses or tenderness,  no skin retraction, no nipple inversion, no nipple discharge, no skin discoloration, no axillary or supraclavicular lymphadenopathy Abdomen: no palpable masses or tenderness, no rebound or guarding Extremities: no edema or skin discoloration or tenderness  Pelvic: Vulva: Normal             Vagina: No gross lesions or discharge  Cervix/Uterus absent  Adnexa  Without masses or tenderness  Anus: Normal   Assessment/Plan:  50 y.o. female for annual exam   1. Well female exam with routine gynecological exam Gynecologic exam status post TAH.  No indication for Pap test at this time.  Breast exam normal.  Screening mammogram February 2022 was negative.  Colonoscopy in 2020.  Health labs with family  physician.  2. S/P TAH (total abdominal hysterectomy)  3. Class 2 severe obesity due to excess calories with serious comorbidity and body mass index (BMI) of 35.0 to 35.9 in adult [facility]) Recommend to continue on a lower calorie/carb diet.  Aerobic activities 5 times a week and light weightlifting every 2 days.  Other orders - tirzepatide [facility]) 5 MG/0.5ML Pen; Inject 5 mg into the skin once a week. - Multiple Vitamin (MULTIVITAMIN PO); Take by mouth. - CALCIUM PO; Take by mouth. chew   [name] MD, 4:23 PM 07/24/2021

## 2021-07-27 ENCOUNTER — Encounter: Payer: Self-pay | Admitting: Obstetrics & Gynecology

## 2021-11-05 ENCOUNTER — Other Ambulatory Visit: Payer: Self-pay | Admitting: Internal Medicine

## 2021-11-05 DIAGNOSIS — Z1231 Encounter for screening mammogram for malignant neoplasm of breast: Secondary | ICD-10-CM

## 2021-11-14 DIAGNOSIS — Z1231 Encounter for screening mammogram for malignant neoplasm of breast: Secondary | ICD-10-CM

## 2021-12-26 ENCOUNTER — Other Ambulatory Visit: Payer: Self-pay

## 2021-12-26 ENCOUNTER — Ambulatory Visit
Admission: RE | Admit: 2021-12-26 | Discharge: 2021-12-26 | Disposition: A | Discharge status: Home / Self Care | Payer: Managed Care, Other (non HMO) | Source: Ambulatory Visit

## 2021-12-26 DIAGNOSIS — Z1231 Encounter for screening mammogram for malignant neoplasm of breast: Secondary | ICD-10-CM

## 2021-12-30 ENCOUNTER — Other Ambulatory Visit: Payer: Self-pay | Admitting: Internal Medicine

## 2021-12-30 DIAGNOSIS — R928 Other abnormal and inconclusive findings on diagnostic imaging of breast: Secondary | ICD-10-CM

## 2022-01-08 ENCOUNTER — Other Ambulatory Visit: Payer: Managed Care, Other (non HMO)

## 2022-01-20 ENCOUNTER — Ambulatory Visit
Admission: RE | Admit: 2022-01-20 | Discharge: 2022-01-20 | Disposition: A | Discharge status: Home / Self Care | Payer: Managed Care, Other (non HMO) | Source: Ambulatory Visit | Attending: Internal Medicine | Admitting: Internal Medicine

## 2022-01-20 DIAGNOSIS — R928 Other abnormal and inconclusive findings on diagnostic imaging of breast: Secondary | ICD-10-CM

## 2022-05-27 ENCOUNTER — Encounter (INDEPENDENT_AMBULATORY_CARE_PROVIDER_SITE_OTHER): Payer: Self-pay

## 2022-07-28 ENCOUNTER — Ambulatory Visit (INDEPENDENT_AMBULATORY_CARE_PROVIDER_SITE_OTHER): Payer: Managed Care, Other (non HMO) | Admitting: Obstetrics & Gynecology

## 2022-07-28 ENCOUNTER — Encounter: Payer: Self-pay | Admitting: Obstetrics & Gynecology

## 2022-07-28 VITALS — BP 118/82 | HR 82 | Ht 65.75 in | Wt 163.0 lb

## 2022-07-28 DIAGNOSIS — Z90710 Acquired absence of both cervix and uterus: Secondary | ICD-10-CM | POA: Diagnosis not present

## 2022-07-28 DIAGNOSIS — Z01419 Encounter for gynecological examination (general) (routine) without abnormal findings: Secondary | ICD-10-CM

## 2022-07-28 NOTE — Progress Notes (Signed)
Crystal Harris Sep 28, 1971 [id number]   History:    51 y.o.  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  Sons 27+ and 84 yo.   RP:  Established patient presenting for annual gyn exam    HPI: S/P TAH/Bilateral salpingectomy for Fibroids in 09/2016.  No vasomotor menopausal Sx.  No pelvic pain.  No pain with IC.  Pap Neg 05/2019.  Will repeat Pap at 5 years. Urine/BMs wnl.  Breasts wnl. Mammo 12/2021, Lt Dx mammo/Breast US 01/2022 Benign. BMI decreased to 26.51.  Successful weight loss with JASNKN. Needs to improve fitness and nutrition. Health labs with Fam MD. Crystal Harris 06/2020.  Past medical history,surgical history, family history and social history were all reviewed and documented in the EPIC chart.  Gynecologic History Patient's last menstrual period was 08/02/2016 (approximate).  Obstetric History OB History  Gravida Para Term Preterm AB Living  '3 2 2   1 2  '$ SAB IAB Ectopic Multiple Live Births    1          # Outcome Date GA Lbr Len/2nd Weight Sex Delivery Anes PTL Lv  3 IAB           2 Term           1 Term             ROS: A ROS was performed and pertinent positives and negatives are included in the history. GENERAL: No fevers or chills. HEENT: No change in vision, no earache, sore throat or sinus congestion. NECK: No pain or stiffness. CARDIOVASCULAR: No chest pain or pressure. No palpitations. PULMONARY: No shortness of breath, cough or wheeze. GASTROINTESTINAL: No abdominal pain, nausea, vomiting or diarrhea, melena or bright red blood per rectum. GENITOURINARY: No urinary frequency, urgency, hesitancy or dysuria. MUSCULOSKELETAL: No joint or muscle pain, no back pain, no recent trauma. DERMATOLOGIC: No rash, no itching, no lesions. ENDOCRINE: No polyuria, polydipsia, no heat or cold intolerance. No recent change in weight. HEMATOLOGICAL: No anemia or easy bruising or bleeding. NEUROLOGIC: No headache, seizures, numbness, tingling or weakness.  PSYCHIATRIC: No depression, no loss of interest in normal activity or change in sleep pattern.     Exam:   BP 118/82   Pulse 82   Ht 5' 5.75" (1.67 m)   Wt 163 lb (73.9 kg)   LMP 08/02/2016 (Approximate)   SpO2 97%   BMI 26.51 kg/m   Body mass index is 26.51 kg/m.  General appearance : Well developed well nourished female. No acute distress HEENT: Eyes: no retinal hemorrhage or exudates,  Neck supple, trachea midline, no carotid bruits, no thyroidmegaly Lungs: Clear to auscultation, no rhonchi or wheezes, or rib retractions  Heart: Regular rate and rhythm, no murmurs or gallops Breast:Examined in sitting and supine position were symmetrical in appearance, no palpable masses or tenderness,  no skin retraction, no nipple inversion, no nipple discharge, no skin discoloration, no axillary or supraclavicular lymphadenopathy Abdomen: no palpable masses or tenderness, no rebound or guarding Extremities: no edema or skin discoloration or tenderness  Pelvic: Vulva: Normal             Vagina: No gross lesions or discharge  Cervix/Uterus absent  Adnexa  Without masses or tenderness  Anus: Normal   Assessment/Plan:  51 y.o. female for annual exam   1. Well female exam with routine gynecological exam S/P TAH/Bilateral salpingectomy for Fibroids in 09/2016.  No vasomotor menopausal Sx. No pelvic pain.  No pain with IC.  Pap Neg 05/2019.  Will repeat Pap at 5 years. Urine/BMs wnl. Breasts wnl. Mammo 12/2021, Lt Dx mammo/Breast US 01/2022 Benign. BMI decreased to 26.51.  Successful weight loss with EEAT[facility].  Needs to improve fitness and nutrition. Health labs with Fam MD. Crystal Harris 06/2020.  2. S/P TAH (total abdominal hysterectomy)  Other orders - WEGOVY 2.4 MG/0.75ML SOAJ; SMARTSIG:2.4 Milligram(s) SUB-Q Once a Week - cetirizine (ZYRTEC) 10 MG tablet; Take 10 mg by mouth daily.   Crystal Bruins MD, 4:25 PM 07/28/2022

## 2022-07-31 ENCOUNTER — Encounter: Payer: Self-pay | Admitting: Obstetrics & Gynecology

## 2022-08-20 ENCOUNTER — Ambulatory Visit: Payer: Managed Care, Other (non HMO) | Admitting: Family Medicine

## 2022-08-20 ENCOUNTER — Encounter: Payer: Self-pay | Admitting: Family Medicine

## 2022-08-20 VITALS — BP 144/61 | HR 77 | Ht 67.0 in | Wt 164.5 lb

## 2022-08-20 DIAGNOSIS — G43109 Migraine with aura, not intractable, without status migrainosus: Secondary | ICD-10-CM | POA: Diagnosis not present

## 2022-08-20 MED ORDER — SUMATRIPTAN SUCCINATE 50 MG PO TABS: ORAL_TABLET | ORAL | 5 refills | Status: AC

## 2022-08-20 MED ORDER — AMITRIPTYLINE HCL 25 MG PO TABS: ORAL_TABLET | ORAL | 3 refills | Status: AC

## 2022-08-20 NOTE — Progress Notes (Signed)
PATIENT: Crystal Harris DOB: 1971-05-03  REASON FOR VISIT: follow up HISTORY FROM: patient  Chief Complaint  Patient presents with   Follow-up    Pt in room #2 and alone. Pt here today for f/u migraines.     HISTORY OF PRESENT ILLNESS:  08/20/22 ALL:  Kateryn returns for follow up for migraines. She was last seen 12/2020 and advised to continue amitriptyline 25-'50mg'$  QHS and sumatriptan '50mg'$  as needed. Since, she reports doing well. She had Covid in 12/2021. Headache have remained well managed. She may have one headache day per month. She has not had a migraine since we last saw her.   She has lost some weight on Wegovy. She has lost about 40lbs. She walks daily. She works two jobs. 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   12/19/2020 ALL: She returns for follow up on migraines. She continues amitriptyline 25-'50mg'$  daily and sumatriptan as needed. Migraines are well managed. She has had more stress recently with changes in her work load which contributes to headaches. She occasionally feels dizzy when she has more stress. No other concerning symptoms. She feels stress is situational and looking for a new job. She has not had to use sumatriptan at all over the past year.   12/14/2019 ALL:  Crystal Harris is a 51 y.o. female here today for follow up for migraines. She reports that, overall, she is doing well. She does note increased stress with work and family that has contributed to more headaches. She also notes that allergy symptoms have played a role. She has 4-6 migraines a year. Eye exam in December, 2020.   HISTORY: (copied from Dr Gladstone Lighter note on 12/12/2018)  UPDATE (12/12/18, VRP): Since last visit, doing well. HA are improved on amitrip. 1 HA every few months. Weather, skipping meals and stress can affect HA. No other alleviating or aggravating factors. Tolerating meds.     PRIOR HPI (05/30/18): 51 year old female here for evaluation of dizziness, vertigo, headaches.   Patient  has history of migraine headaches, on the top of her head, throbbing sensation associate with nausea, photophobia, phonophobia.  She has about 6 of these per month which are mild and one severe one every 3 to 4 months.  She uses amitriptyline and sumatriptan with fairly good results.   Patient also having an episode of left arm and leg weakness lasting for 1 to 2 days in May 2019.  This was associated with headache.  Patient went to the emergency room had CT scan head which is unremarkable.  Patient was referred to neurology clinic for further evaluation.   REVIEW OF SYSTEMS: Out of a complete 14 system review of symptoms, the patient complains only of the following symptoms, headaches and all other reviewed systems are negative.   ALLERGIES: No Known Allergies  HOME MEDICATIONS: Outpatient Medications Prior to Visit  Medication Sig Dispense Refill   CALCIUM PO Take by mouth. chew     cetirizine (ZYRTEC) 10 MG tablet Take 10 mg by mouth daily.     Multiple Vitamin (MULTIVITAMIN PO) Take by mouth.     rosuvastatin (CRESTOR) 10 MG tablet Take 10 mg by mouth at bedtime.     WEGOVY 2.4 MG/0.75ML SOAJ SMARTSIG:2.4 Milligram(s) SUB-Q Once a Week     amitriptyline (ELAVIL) 25 MG tablet Take 1-2 tablets every night at bedtime 180 tablet 3   SUMAtriptan (IMITREX) 50 MG tablet May repeat in 2 hours as needed. Max 2 tabs per day. Max  8 tabs per month. 8 tablet 5   No facility-administered medications prior to visit.    PAST MEDICAL HISTORY: Past Medical History:  Diagnosis Date   Allergy    Anxiety    Claustophobic - no meds0   Arthritis    lower back, rupture disk L5, bulging disk L4, no meds   Bronchitis    Chest congestion    GERD (gastroesophageal reflux disease)    occasional, no meds, diet controlled   Headache    Migraines   Heart murmur    mild during pregnancy, never caused any problems   Hypercholesterolemia    Pneumonia    Prediabetes    Shortness of breath dyspnea     Sinus congestion     PAST SURGICAL HISTORY: Past Surgical History:  Procedure Laterality Date   ABDOMINAL HYSTERECTOMY N/A 08/11/2016   Procedure: HYSTERECTOMY ABDOMINAL;  Surgeon: Terrance Mass, MD;  Location: [facility];  Service: Gynecology;  Laterality: N/A;   BARTHOLIN CYST MARSUPIALIZATION     BILATERAL SALPINGECTOMY Bilateral 08/11/2016   Procedure: BILATERAL SALPINGECTOMY;  Surgeon: Terrance Mass, MD;  Location: [facility];  Service: Gynecology;  Laterality: Bilateral;   BREAST BIOPSY Left 11/23/2019   CESAREAN SECTION     x 1   WISDOM TOOTH EXTRACTION      FAMILY HISTORY: Family History  Problem Relation Age of Onset   Diabetes Mother    Hyperlipidemia Mother    Emphysema Mother    Cancer Father        brain   Hypertension Father    Cancer Sister        lung to brain   Hypertension Sister    Hypertension Brother    Emphysema Maternal Grandmother    Stroke Maternal Grandfather    Breast cancer Paternal Grandmother    Blindness Paternal Grandmother    Esophageal cancer Neg Hx    Rectal cancer Neg Hx    Stomach cancer Neg Hx     SOCIAL HISTORY: Social History   Socioeconomic History   Marital status: Divorced    Spouse name: Not on file   Number of children: 2   Years of education: comm college   Highest education level: Not on file  Occupational History    Comment: Engineer, maintenance  Tobacco Use   Smoking status: Never   Smokeless tobacco: Never  Vaping Use   Vaping Use: Never used  Substance and Sexual Activity   Alcohol use: Not Currently    Comment: social   Drug use: No   Sexual activity: Yes    Partners: Male    Birth control/protection: Surgical    Comment: 1st intercourse- 22, partners- more than 5 hysterectomy  Other Topics Concern   Not on file  Social History Narrative   Lives with boyfriend   Caffeine- 2 cups coffee daily   Social Determinants of Health   Financial Resource Strain: Not on file  Food Insecurity: Not on file   Transportation Needs: Not on file  Physical Activity: Not on file  Stress: Not on file  Social Connections: Not on file  Intimate Partner Violence: Not on file      PHYSICAL EXAM  Vitals:   08/20/22 1331  BP: (!) 144/61  Pulse: 77  Weight: 164 lb 8 oz (74.6 kg)  Height: '5\' 7"'$  (1.702 m)   Body mass index is 25.76 kg/m.  Generalized: Well developed, in no acute distress  Cardiology: normal rate and rhythm, no murmur noted Respiratory:  clear to auscultation bilaterally  Neurological examination  Mentation: Alert oriented to time, place, history taking. Follows all commands speech and language fluent Cranial nerve II-XII: Pupils were equal round reactive to light. Extraocular movements were full, visual field were full on confrontational test. Facial sensation and strength were normal. Head turning and shoulder shrug  were normal and symmetric. Motor: The motor testing reveals 5 over 5 strength of all 4 extremities. Good symmetric motor tone is noted throughout.  Coordination: Cerebellar testing reveals good finger-nose-finger  Gait and station: Gait is normal.   DIAGNOSTIC DATA (LABS, IMAGING, TESTING) - I reviewed patient records, labs, notes, testing and imaging myself where available.      No data to display           Lab Results  Component Value Date   WBC 8.5 02/22/2018   HGB 15.4 (H) 02/22/2018   HCT 45.8 02/22/2018   MCV 85.3 02/22/2018   PLT 312 02/22/2018      Component Value Date/Time   NA 139 02/22/2018 1451   NA 139 01/14/2017 1536   K 4.0 02/22/2018 1451   CL 102 02/22/2018 1451   CO2 27 02/22/2018 1451   GLUCOSE 167 (H) 02/22/2018 1451   BUN 12 02/22/2018 1451   BUN 13 01/14/2017 1536   CREATININE 1.12 (H) 02/22/2018 1451   CALCIUM 9.8 02/22/2018 1451   PROT 6.7 01/14/2017 1536   ALBUMIN 4.1 01/14/2017 1536   AST 17 01/14/2017 1536   ALT 14 01/14/2017 1536   ALKPHOS 93 01/14/2017 1536   BILITOT 0.2 01/14/2017 1536   GFRNONAA 57 (L)  02/22/2018 1451   GFRAA >60 02/22/2018 1451   Lab Results  Component Value Date   CHOL 213 (H) 01/14/2017   HDL 42 01/14/2017   LDLCALC 116 (H) 01/14/2017   TRIG 274 (H) 01/14/2017   CHOLHDL 5.1 (H) 01/14/2017   Lab Results  Component Value Date   HGBA1C 6.1 (H) 01/14/2017   No results found for: "VITAMINB12" Lab Results  Component Value Date   TSH 1.730 01/14/2017     ASSESSMENT AND PLAN 51 y.o. year old female  has a past medical history of Allergy, Anxiety, Arthritis, Bronchitis, Chest congestion, GERD (gastroesophageal reflux disease), Headache, Heart murmur, Hypercholesterolemia, Pneumonia, Prediabetes, Shortness of breath dyspnea, and Sinus congestion. here with     ICD-10-CM   1. Migraine with aura and without status migrainosus, not intractable  G43.109         Kortni is doing well, overall. We will continue amitriptyline to 1-2 tablets at bedtime. She will continue sumatriptan as needed for migraine abortion. She is followed regularly by PCP. Well balanced diet and regular exercise advised. She will stay well hydrated. She may continue refills with PCP, otherwise, follow up with me in 1 year, sooner if needed.    No orders of the defined types were placed in this encounter.    Meds ordered this encounter  Medications   amitriptyline (ELAVIL) 25 MG tablet    Sig: Take 1-2 tablets every night at bedtime    Dispense:  180 tablet    Refill:  3    Order Specific Question:   Supervising Provider    Answer:   Melvenia Beam [[id number]]   SUMAtriptan (IMITREX) 50 MG tablet    Sig: May repeat in 2 hours as needed. Max 2 tabs per day. Max 8 tabs per month.    Dispense:  8 tablet    Refill:  5  Order Specific Question:   Supervising Provider    Answer:   Melvenia Beam [[id number]]      PEL GKBOQ, FNP-C 08/20/2022, 2:00 PM [facility] [address] [place], [place] [address] [phone]

## 2022-08-20 NOTE — Patient Instructions (Signed)
Below is our plan:  We will continue amitriptyline 25-'50mg'$  daily and sumatriptan as needed.   Please make sure you are staying well hydrated. I recommend 50-60 ounces daily. Well balanced diet and regular exercise encouraged. Consistent sleep schedule with 6-8 hours recommended.   Please continue follow up with care team as directed.   Follow up with me in 1 year, sooner if needed. May continue refills with PCP and follow up with me as needed.   You may receive a survey regarding today's visit. I encourage you to leave honest feed back as I do use this information to improve patient care. Thank you for seeing me today!

## 2023-05-11 ENCOUNTER — Other Ambulatory Visit: Payer: Self-pay | Admitting: Internal Medicine

## 2023-05-11 DIAGNOSIS — Z1231 Encounter for screening mammogram for malignant neoplasm of breast: Secondary | ICD-10-CM

## 2023-05-19 ENCOUNTER — Ambulatory Visit
Admission: RE | Admit: 2023-05-19 | Discharge: 2023-05-19 | Disposition: A | Discharge status: Home / Self Care | Payer: Commercial Managed Care - PPO | Source: Ambulatory Visit | Attending: Internal Medicine

## 2023-05-19 DIAGNOSIS — Z1231 Encounter for screening mammogram for malignant neoplasm of breast: Secondary | ICD-10-CM

## 2023-07-30 ENCOUNTER — Ambulatory Visit: Payer: Managed Care, Other (non HMO) | Admitting: Obstetrics & Gynecology

## 2023-08-03 ENCOUNTER — Ambulatory Visit (INDEPENDENT_AMBULATORY_CARE_PROVIDER_SITE_OTHER): Payer: Commercial Managed Care - PPO | Admitting: Obstetrics and Gynecology

## 2023-08-03 ENCOUNTER — Encounter: Payer: Self-pay | Admitting: Obstetrics and Gynecology

## 2023-08-03 VITALS — BP 112/72 | HR 94 | Ht 66.0 in | Wt 201.0 lb

## 2023-08-03 DIAGNOSIS — Z01419 Encounter for gynecological examination (general) (routine) without abnormal findings: Secondary | ICD-10-CM

## 2023-08-03 DIAGNOSIS — Z1231 Encounter for screening mammogram for malignant neoplasm of breast: Secondary | ICD-10-CM

## 2023-08-03 MED ORDER — ESTRADIOL 0.1 MG/GM VA CREA: 1.0000 | TOPICAL_CREAM | Freq: Every day | VAGINAL | 12 refills | Status: DC

## 2023-08-03 NOTE — Progress Notes (Signed)
52 y.o. y.o. female here for annual exam.   Patient's last menstrual period was 08/02/2016 (approximate).   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  Boyfriend x 17 years.  Sons 27+ and 83 yo.   RP:  Established patient presenting for annual gyn exam    HPI: S/P TAH/Bilateral salpingectomy for Fibroids in 09/2016.  Ovaries intact.  Hot flashes improved bu has dyspareunia.  No pelvic pain.  No pain with IC.  Pap Neg 05/2019.  Will repeat Pap at 5 years. Urine/BMs wnl.  Breasts wnl. Mammo 12/2021, Lt Dx mammo/Breast US 01/2022 Benign. BMI decreased to 26.51.  Successful weight loss with [id number]. Needs to improve fitness and nutrition. Health labs with Fam MD. Alen Bleacher 06/2020.  Body mass index is 32.44 kg/m.   Last mammogram: 2024-ordered through [facility] Last colonoscopy: 2021  Height 5\' 6"  (1.676 m), weight 201 lb (91.2 kg), last menstrual period 08/02/2016.  No results found for: "DIAGPAP", "HPVHIGH", "ADEQPAP"  GYN HISTORY: No results found for: "DIAGPAP", "HPVHIGH", "ADEQPAP"  OB History  Gravida Para Term Preterm AB Living  3 2 2   1 2   SAB IAB Ectopic Multiple Live Births    1          # Outcome Date GA Lbr Len/2nd Weight Sex Type Anes PTL Lv  3 IAB           2 Term           1 Term             Past Medical History:  Diagnosis Date   Allergy    Anxiety    Claustophobic - no meds0   Arthritis    lower back, rupture disk L5, bulging disk L4, no meds   Bronchitis    Chest congestion    GERD (gastroesophageal reflux disease)    occasional, no meds, diet controlled   Headache    Migraines   Heart murmur    mild during pregnancy, never caused any problems   Hypercholesterolemia    Pneumonia    Prediabetes    Shortness of breath dyspnea    Sinus congestion     Past Surgical History:  Procedure Laterality Date   ABDOMINAL HYSTERECTOMY N/A 08/11/2016   Procedure: HYSTERECTOMY ABDOMINAL;  Surgeon: Ok Edwards, MD;  Location: [facility];   Service: Gynecology;  Laterality: N/A;   BARTHOLIN CYST MARSUPIALIZATION     BILATERAL SALPINGECTOMY Bilateral 08/11/2016   Procedure: BILATERAL SALPINGECTOMY;  Surgeon: Ok Edwards, MD;  Location: [facility];  Service: Gynecology;  Laterality: Bilateral;   BREAST BIOPSY Left 11/23/2019   CESAREAN SECTION     x 1   WISDOM TOOTH EXTRACTION      Current Outpatient Medications on File Prior to Visit  Medication Sig Dispense Refill   amitriptyline (ELAVIL) 25 MG tablet Take 1-2 tablets every night at bedtime 180 tablet 3   busPIRone (BUSPAR) 7.5 MG tablet 1 tablet Orally Twice a day As needed     CALCIUM PO Take by mouth. chew     cetirizine (ZYRTEC) 10 MG tablet Take 10 mg by mouth daily.     loratadine (CLARITIN) 10 MG tablet 1 tablet Orally Once a day     Multiple Vitamin (MULTI VITAMIN) TABS 1 tablet Orally Once a day     rosuvastatin (CRESTOR) 10 MG tablet Take 10 mg by mouth at bedtime.     SUMAtriptan (IMITREX) 50 MG tablet May repeat in 2 hours  as needed. Max 2 tabs per day. Max 8 tabs per month. 8 tablet 5   albuterol (VENTOLIN HFA) 108 (90 Base) MCG/ACT inhaler 2 puffs as needed for sob, wheezing, cough urgency Inhalation every 4 hrs for 30 days (Patient not taking: Reported on 08/03/2023)     fluticasone (FLONASE) 50 MCG/ACT nasal spray 1 spray in each nostril Nasally Once a day As needed (Patient not taking: Reported on 08/03/2023)     No current facility-administered medications on file prior to visit.    Social History   Socioeconomic History   Marital status: Divorced    Spouse name: Not on file   Number of children: 2   Years of education: comm college   Highest education level: Not on file  Occupational History    Comment: Environmental health practitioner  Tobacco Use   Smoking status: Never   Smokeless tobacco: Never  Vaping Use   Vaping status: Never Used  Substance and Sexual Activity   Alcohol use: Yes    Comment: social   Drug use: No   Sexual activity: Yes     Partners: Male    Birth control/protection: Surgical    Comment: 1st intercourse- 12, partners- more than 5 hysterectomy  Other Topics Concern   Not on file  Social History Narrative   Lives with boyfriend   Caffeine- 2 cups coffee daily   Social Determinants of Health   Financial Resource Strain: Not on file  Food Insecurity: Not on file  Transportation Needs: Not on file  Physical Activity: Not on file  Stress: Not on file  Social Connections: Not on file  Intimate Partner Violence: Not on file    Family History  Problem Relation Age of Onset   Diabetes Mother    Hyperlipidemia Mother    Emphysema Mother    Cancer Father        brain   Hypertension Father    Cancer Sister        lung to brain   Hypertension Sister    Hypertension Brother    Emphysema Maternal Grandmother    Stroke Maternal Grandfather    Breast cancer Paternal Grandmother    Blindness Paternal Grandmother    Esophageal cancer Neg Hx    Rectal cancer Neg Hx    Stomach cancer Neg Hx      Allergies  Allergen Reactions   Wheat     Other Reaction(s): Unknown      Patient's last menstrual period was Patient's last menstrual period was 08/02/2016 (approximate)..            Review of Systems Alls systems reviewed and are negative.     Physical Exam Constitutional:      Appearance: Normal appearance.  Genitourinary:     Vulva normal.     No lesions in the vagina.     Right Labia: No rash, lesions or skin changes.    Left Labia: No lesions, skin changes or rash.    Vaginal cuff intact.    No vaginal discharge or tenderness.     No vaginal prolapse present.    Mild vaginal atrophy present.     Right Adnexa: not tender and no mass present.    Left Adnexa: not tender and no mass present.    Cervix is not absent.     Uterus is not absent. Breasts:    Right: Normal.     Left: Normal.  HENT:     Head: Normocephalic.  Neck:  Thyroid: No thyroid mass, thyromegaly or thyroid  tenderness.  Cardiovascular:     Rate and Rhythm: Normal rate and regular rhythm.     Heart sounds: Normal heart sounds, S1 normal and S2 normal.  Pulmonary:     Effort: Pulmonary effort is normal.     Breath sounds: Normal breath sounds and air entry.  Abdominal:     General: There is no distension.     Palpations: Abdomen is soft. There is no mass.     Tenderness: There is no abdominal tenderness. There is no guarding or rebound.  Musculoskeletal:        General: Normal range of motion.     Cervical back: Full passive range of motion without pain, normal range of motion and neck supple. No tenderness.     Right lower leg: No edema.     Left lower leg: No edema.  Neurological:     Mental Status: She is alert.  Skin:    General: Skin is warm.  Psychiatric:        Mood and Affect: Mood normal.        Behavior: Behavior normal.        Thought Content: Thought content normal.  Vitals and nursing note reviewed. Exam conducted with a chaperone present.       A:         Well Woman GYN exam                             P:        Pap smear not indicated Encouraged annual mammogram screening Colon cancer screening up-to-date Atrophic vaginitis: to apply vaginal estrogen cream as directed.  Counseled on estrogen patch as well.  She will consider this Labs and immunizations to do with PMD Discussed breast self exams Encouraged healthy lifestyle practices Encouraged Vit D and Calcium   No follow-ups on file.  Earley Favor

## 2024-05-04 ENCOUNTER — Other Ambulatory Visit: Payer: Self-pay | Admitting: Internal Medicine

## 2024-05-04 DIAGNOSIS — Z1231 Encounter for screening mammogram for malignant neoplasm of breast: Secondary | ICD-10-CM

## 2024-05-23 ENCOUNTER — Ambulatory Visit
Admission: RE | Admit: 2024-05-23 | Discharge: 2024-05-23 | Disposition: A | Discharge status: Home / Self Care | Source: Ambulatory Visit | Attending: Internal Medicine | Admitting: Internal Medicine

## 2024-05-23 DIAGNOSIS — Z1231 Encounter for screening mammogram for malignant neoplasm of breast: Secondary | ICD-10-CM

## 2024-09-04 ENCOUNTER — Ambulatory Visit: Admitting: Obstetrics and Gynecology

## 2024-09-04 ENCOUNTER — Encounter: Payer: Self-pay | Admitting: Obstetrics and Gynecology

## 2024-09-04 VITALS — BP 110/70 | HR 89 | Ht 65.55 in | Wt 197.2 lb

## 2024-09-04 DIAGNOSIS — E041 Nontoxic single thyroid nodule: Secondary | ICD-10-CM

## 2024-09-04 DIAGNOSIS — Z1331 Encounter for screening for depression: Secondary | ICD-10-CM

## 2024-09-04 DIAGNOSIS — E2839 Other primary ovarian failure: Secondary | ICD-10-CM

## 2024-09-04 DIAGNOSIS — Z01419 Encounter for gynecological examination (general) (routine) without abnormal findings: Secondary | ICD-10-CM

## 2024-09-04 MED ORDER — ESTRADIOL 0.01 % VA CREA: 1.0000 | TOPICAL_CREAM | Freq: Every day | VAGINAL | 12 refills | Status: AC

## 2024-09-04 NOTE — Patient Instructions (Addendum)
 Perimenopause/Menopause suggestions   You should be getting 1,200 mg of calcium  a day between your diet and supplements and at least 3000 IU a day of Vit D. You should exercise regularly with weight bearing exercises. I would recommend a bone density q 2 years.  We loose 5% per year in this time period of our bone density, due to the loss of estrogen. Magnesium  at bedtime for our sleep and bones (follow recommended dose on bottle)  Please read The New Menopause my Dr. Ronal Estefana Morton and Estrogen Matters  Try to avoid caffeine and alcohol in this period, as they can make symptoms worse  Continue annual mammograms, unless told sooner  Please reach out with any questions Almarie MARLA Carpen  Locations you can schedule your bone scan are:  The [facility] bone scan location scheduling line is [phone]  [facility] is [phone]  [facility] radiology [phone]   [facility] [phone]  [facility] Zelda Salmon: [phone]    Another option if they are behind is [facility] and their number is [phone].  I can send the referral if you want to go there.    Please let me know if you have any trouble scheduling. This is important for management of osteoporosis or osteopenia.   I can sit down with you after the scan to discuss results and treatment.  Dr. Carpen

## 2024-09-04 NOTE — Progress Notes (Signed)
 53 y.o. y.o. female here for annual exam. Patient's last menstrual period was 08/02/2016 (approximate).      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  Sons 27+ and 71 yo.   RP:  Established patient presenting for annual gyn exam    HPI: S/P TAH/Bilateral salpingectomy for Fibroids in 09/2016.  Ovaries intact.  Hot flashes improved bu has dyspareunia.  No pelvic pain.  No pain with IC.  Pap Neg 05/2019.  Will repeat Pap at 5 years. Urine/BMs wnl.  Breasts wnl. Mammo 05/23/24, Lt Dx mammo/Breast US  01/2022 Benign.   Successful weight loss with [employer]. Needs to improve fitness and nutrition. Health labs with Fam MD. Levis 06/2020 repeat in 7 years Was at 26 BMI Body mass index is 32.44 kg/m. DXA: none referral placed. Mother with recent fall and vertebral fracture from osteoporosis. Mother is staring prolia this month. She has not had any fractures UTIx2 in the past. Did not start vaginal estrogen   Last colonoscopy: 2021 There is no height or weight on file to calculate BMI.     08/21/2017    2:31 PM 08/13/2017    5:00 PM 08/10/2017    5:32 PM  Depression screen PHQ 2/9  Decreased Interest 0 0 0  Down, Depressed, Hopeless 0 0 0  PHQ - 2 Score 0 0 0   No results found for: DIAGPAP, HPVHIGH, ADEQPAP  Last menstrual period 08/02/2016.  No results found for: DIAGPAP, HPVHIGH, ADEQPAP  GYN HISTORY: No results found for: DIAGPAP, HPVHIGH, ADEQPAP  OB History  Gravida Para Term Preterm AB Living  3 2 2  1 2   SAB IAB Ectopic Multiple Live Births   1       # Outcome Date GA Lbr Len/2nd Weight Sex Type Anes PTL Lv  3 IAB           2 Term           1 Term             Past Medical History:  Diagnosis Date   Allergy    Anxiety    Claustophobic - no meds0   Arthritis    lower back, rupture disk L5, bulging disk L4, no meds   Bronchitis    Chest congestion    GERD (gastroesophageal reflux disease)    occasional, no  meds, diet controlled   Headache    Migraines   Heart murmur    mild during pregnancy, never caused any problems   Hypercholesterolemia    Pneumonia    Prediabetes    Shortness of breath dyspnea    Sinus congestion     Past Surgical History:  Procedure Laterality Date   ABDOMINAL HYSTERECTOMY N/A 08/11/2016   Procedure: HYSTERECTOMY ABDOMINAL;  Surgeon: Curlee VEAR Guan, MD;  Location: [facility];  Service: Gynecology;  Laterality: N/A;   BARTHOLIN CYST MARSUPIALIZATION     BILATERAL SALPINGECTOMY Bilateral 08/11/2016   Procedure: BILATERAL SALPINGECTOMY;  Surgeon: Curlee VEAR Guan, MD;  Location: [facility];  Service: Gynecology;  Laterality: Bilateral;   BREAST BIOPSY Left 11/23/2019   CESAREAN SECTION     x 1   WISDOM TOOTH EXTRACTION      Current Outpatient Medications on File Prior to Visit  Medication Sig Dispense Refill   albuterol  (VENTOLIN  HFA) 108 (90 Base) MCG/ACT inhaler 2 puffs as needed for sob, wheezing, cough urgency Inhalation every 4 hrs for 30 days (Patient taking differently: as needed.)  amitriptyline  (ELAVIL ) 25 MG tablet Take 1-2 tablets every night at bedtime 180 tablet 3   CALCIUM PO Take by mouth. chew     fluticasone (FLONASE) 50 MCG/ACT nasal spray 1 spray in each nostril Nasally Once a day As needed     loratadine (CLARITIN) 10 MG tablet 1 tablet Orally Once a day     Multiple Vitamin (MULTI VITAMIN) TABS 1 tablet Orally Once a day     SUMAtriptan  (IMITREX ) 50 MG tablet May repeat in 2 hours as needed. Max 2 tabs per day. Max 8 tabs per month. 8 tablet 5   busPIRone (BUSPAR) 7.5 MG tablet 1 tablet Orally Twice a day As needed (Patient not taking: Reported on 09/04/2024)     cetirizine  (ZYRTEC ) 10 MG tablet Take 10 mg by mouth daily.     estradiol  (ESTRACE  VAGINAL) 0.1 MG/GM vaginal cream Place 1 Applicatorful vaginally at bedtime. Apply pea size amount to vagina at bedtime x3 weeks then can use 3 times a week thereafter 42.5 g 12   rosuvastatin (CRESTOR)  10 MG tablet Take 10 mg by mouth at bedtime.     No current facility-administered medications on file prior to visit.    Social History   Socioeconomic History   Marital status: Divorced    Spouse name: Not on file   Number of children: 2   Years of education: comm college   Highest education level: Not on file  Occupational History    Comment: Environmental Health Practitioner  Tobacco Use   Smoking status: Never   Smokeless tobacco: Never  Vaping Use   Vaping status: Never Used  Substance and Sexual Activity   Alcohol use: Yes    Comment: social   Drug use: No   Sexual activity: Yes    Partners: Male    Birth control/protection: Surgical    Comment: 1st intercourse- 12, partners- more than 5 hysterectomy  Other Topics Concern   Not on file  Social History Narrative   Lives with boyfriend   Caffeine- 2 cups coffee daily   Social Drivers of Corporate Investment Banker Strain: Not on file  Food Insecurity: Not on file  Transportation Needs: Not on file  Physical Activity: Not on file  Stress: Not on file  Social Connections: Not on file  Intimate Partner Violence: Not on file    Family History  Problem Relation Age of Onset   Diabetes Mother    Hyperlipidemia Mother    Emphysema Mother    Hypertension Father    Brain cancer Father    Brain cancer Sister        lung to brain   Hypertension Sister    Lung cancer Sister        lung to brain   Emphysema Maternal Grandmother    Stroke Maternal Grandfather    Breast cancer Paternal Grandmother 45 - 4   Blindness Paternal Grandmother    Hypertension Brother    Esophageal cancer Neg Hx    Rectal cancer Neg Hx    Stomach cancer Neg Hx      Allergies  Allergen Reactions   Dust Mite Extract    Wheat     Other Reaction(s): Unknown      Patient's last menstrual period was Patient's last menstrual period was 08/02/2016 (approximate)..            Review of Systems Alls systems reviewed and are negative.      Physical Exam Constitutional:  Appearance: Normal appearance.   Genitourinary:     Vulva normal.     No lesions in the vagina.     Right Labia: No rash, lesions or skin changes.    Left Labia: No lesions, skin changes or rash.    Vaginal cuff intact.    No vaginal discharge or tenderness.     No vaginal prolapse present.    No vaginal atrophy present.     Right Adnexa: not absent.    Left Adnexa: not absent.    Cervix is not absent.     Uterus is not absent. Breasts:    Right: Normal.     Left: Normal.  HENT:     Head: Normocephalic.  Neck:     Thyroid: No thyroid mass, thyromegaly or thyroid tenderness.  Cardiovascular:     Rate and Rhythm: Normal rate and regular rhythm.     Heart sounds: Normal heart sounds, S1 normal and S2 normal.  Pulmonary:     Effort: Pulmonary effort is normal.     Breath sounds: Normal breath sounds and air entry.  Abdominal:     General: Bowel sounds are normal. There is no distension.     Palpations: Abdomen is soft. There is no mass.     Tenderness: There is no abdominal tenderness. There is no guarding or rebound.  Musculoskeletal:     Cervical back: Full passive range of motion without pain, normal range of motion and neck supple. No tenderness.     Right lower leg: No edema.     Left lower leg: No edema.  Neurological:     Mental Status: She is alert.  Skin:    General: Skin is warm.  Psychiatric:        Mood and Affect: Mood normal.        Behavior: Behavior normal.        Thought Content: Thought content normal.  Vitals and nursing note reviewed. Exam conducted with a chaperone present.       A:         Well Woman GYN exam             Hysterectomy Possible thyroid nodule                 P:        Pap smear not indicated Encouraged annual mammogram screening Colon cancer screening up-to-date DXA ordered today Labs and immunizations to do with PMD Referral for thyroid ultrasound placed Encouraged healthy lifestyle  practices Encouraged Vit D and Calcium   No follow-ups on file.  Crystal Harris

## 2024-09-19 ENCOUNTER — Other Ambulatory Visit: Payer: Self-pay | Admitting: Obstetrics and Gynecology

## 2024-09-19 ENCOUNTER — Ambulatory Visit: Payer: Self-pay | Admitting: Obstetrics and Gynecology

## 2024-09-19 ENCOUNTER — Ambulatory Visit (HOSPITAL_BASED_OUTPATIENT_CLINIC_OR_DEPARTMENT_OTHER)
Admission: RE | Admit: 2024-09-19 | Discharge: 2024-09-19 | Disposition: A | Source: Ambulatory Visit | Attending: Obstetrics and Gynecology

## 2024-09-19 ENCOUNTER — Ambulatory Visit (HOSPITAL_BASED_OUTPATIENT_CLINIC_OR_DEPARTMENT_OTHER)
Admission: RE | Admit: 2024-09-19 | Discharge: 2024-09-19 | Disposition: A | Discharge status: Home / Self Care | Payer: Self-pay | Source: Ambulatory Visit | Attending: Obstetrics and Gynecology | Admitting: Obstetrics and Gynecology

## 2024-09-19 DIAGNOSIS — E041 Nontoxic single thyroid nodule: Secondary | ICD-10-CM | POA: Diagnosis present

## 2024-09-19 DIAGNOSIS — E2839 Other primary ovarian failure: Secondary | ICD-10-CM | POA: Insufficient documentation

## 2024-09-19 DIAGNOSIS — Z01419 Encounter for gynecological examination (general) (routine) without abnormal findings: Secondary | ICD-10-CM | POA: Insufficient documentation

## 2024-11-09 ENCOUNTER — Ambulatory Visit (HOSPITAL_BASED_OUTPATIENT_CLINIC_OR_DEPARTMENT_OTHER)
Admission: RE | Admit: 2024-11-09 | Discharge: 2024-11-09 | Disposition: A | Discharge status: Home / Self Care | Source: Ambulatory Visit | Attending: Obstetrics and Gynecology | Admitting: Obstetrics and Gynecology

## 2024-11-09 DIAGNOSIS — E2839 Other primary ovarian failure: Secondary | ICD-10-CM | POA: Diagnosis present

## 2024-11-09 DIAGNOSIS — Z01419 Encounter for gynecological examination (general) (routine) without abnormal findings: Secondary | ICD-10-CM | POA: Insufficient documentation

## 2025-09-05 ENCOUNTER — Ambulatory Visit: Admitting: Obstetrics and Gynecology
